# Patient Record
Sex: Male | Born: 1960 | Race: White | Hispanic: No | Marital: Single | Smoking: Former smoker
Health system: Southern US, Community
[De-identification: ages and names within clinical notes are randomized; demographics above are authoritative.]

## PROBLEM LIST (undated history)

## (undated) DIAGNOSIS — F419 Anxiety disorder, unspecified: Secondary | ICD-10-CM

## (undated) DIAGNOSIS — B20 Human immunodeficiency virus [HIV] disease: Secondary | ICD-10-CM

## (undated) HISTORY — DX: Human immunodeficiency virus (HIV) disease: B20

## (undated) HISTORY — DX: Anxiety disorder, unspecified: F41.9

---

## 1992-04-11 DIAGNOSIS — Z21 Asymptomatic human immunodeficiency virus [HIV] infection status: Secondary | ICD-10-CM

## 1992-04-11 DIAGNOSIS — B20 Human immunodeficiency virus [HIV] disease: Secondary | ICD-10-CM

## 1992-04-11 HISTORY — DX: Human immunodeficiency virus (HIV) disease: B20

## 1992-04-11 HISTORY — DX: Asymptomatic human immunodeficiency virus (hiv) infection status: Z21

## 2008-07-19 ENCOUNTER — Emergency Department (HOSPITAL_COMMUNITY): Admission: EM | Admit: 2008-07-19 | Discharge: 2008-07-19 | Payer: Self-pay | Admitting: Emergency Medicine

## 2014-06-04 ENCOUNTER — Ambulatory Visit: Payer: Self-pay | Admitting: Infectious Diseases

## 2014-06-13 DIAGNOSIS — R030 Elevated blood-pressure reading, without diagnosis of hypertension: Secondary | ICD-10-CM | POA: Insufficient documentation

## 2014-06-13 DIAGNOSIS — F419 Anxiety disorder, unspecified: Secondary | ICD-10-CM | POA: Insufficient documentation

## 2014-06-13 DIAGNOSIS — B20 Human immunodeficiency virus [HIV] disease: Secondary | ICD-10-CM | POA: Insufficient documentation

## 2014-06-16 ENCOUNTER — Encounter: Payer: Self-pay | Admitting: *Deleted

## 2014-06-16 ENCOUNTER — Encounter: Payer: Self-pay | Admitting: Internal Medicine

## 2014-06-16 ENCOUNTER — Ambulatory Visit (INDEPENDENT_AMBULATORY_CARE_PROVIDER_SITE_OTHER): Payer: 59 | Admitting: Internal Medicine

## 2014-06-16 VITALS — BP 151/96 | HR 81 | Temp 98.2°F | Ht 71.5 in | Wt 186.5 lb

## 2014-06-16 DIAGNOSIS — Z113 Encounter for screening for infections with a predominantly sexual mode of transmission: Secondary | ICD-10-CM

## 2014-06-16 DIAGNOSIS — B20 Human immunodeficiency virus [HIV] disease: Secondary | ICD-10-CM

## 2014-06-16 DIAGNOSIS — Z79899 Other long term (current) drug therapy: Secondary | ICD-10-CM

## 2014-06-16 LAB — CBC
HEMATOCRIT: 44.6 % (ref 39.0–52.0)
Hemoglobin: 15.7 g/dL (ref 13.0–17.0)
MCH: 32.2 pg (ref 26.0–34.0)
MCHC: 35.2 g/dL (ref 30.0–36.0)
MCV: 91.6 fL (ref 78.0–100.0)
MPV: 11 fL (ref 8.6–12.4)
PLATELETS: 206 10*3/uL (ref 150–400)
RBC: 4.87 MIL/uL (ref 4.22–5.81)
RDW: 13.5 % (ref 11.5–15.5)
WBC: 4.5 10*3/uL (ref 4.0–10.5)

## 2014-06-16 NOTE — Progress Notes (Signed)
Patient ID: Joshua BlackwaterLindsey W Davis, male   DOB: 01/20/1961, 54 y.o.   MRN: [id number]          Patient Active Problem List   Diagnosis Date Noted  . Human immunodeficiency virus (HIV) disease 06/13/2014  . Anxiety disorder   . Elevated blood pressure reading without diagnosis of hypertension     Patient's Medications  New Prescriptions   No medications on file  Previous Medications   ALPRAZOLAM (XANAX) 0.5 MG TABLET    Take 0.5 mg by mouth 2 (two) times daily.   EFAVIRENZ-EMTRICITABINE-TENOFOVIR (ATRIPLA) 600-200-300 MG PER TABLET    Take 1 tablet by mouth at bedtime.  Modified Medications   No medications on file  Discontinued Medications   No medications on file    Subjective: Joshua Davis is in for his initial visit to establish ongoing care for his HIV infection. He was diagnosed in 1994 when he was being screened as a possible candidate to donate a kidney to his brother who had renal failure. He states he had been worried that he might have HIV infection after having had an unusual mononucleosis-like illness the year previous. Since learning of his diagnosis he shared that information with his family and several close friends and feels he has good support. He started on therapy about 6 months after diagnosis. [facility] and [facility]. He was last seen there about one year ago. He recalls having taken AZT and tenofovir in the past. He recalls having difficulty tolerating AZT. He has been on Atripla for at least the past 6 years and has no problems tolerating it. He has had several instances where he had difficulty with his insurance and had lapses in therapy. He was out of Atripla for about 3 months and then restarted it in early February. His CD4 count at that time was 676 and his viral load was 6400. He is not aware of ever having had a complication related to his HIV infection. Past viral loads  have been undetectable other than one blip at [facility] when he had insurance difficulties and had a lapse in therapy previously.  He has been in good health otherwise. He quit smoking cigarettes about one year ago. He has never been hospitalized. He has never had any surgery and he has no known allergies. He does take Xanax each evening before going to bed for chronic anxiety and difficulty with sleep. He is also on a multivitamin. He is in a current relationship with a male partner. His partner is negative and aware of his status. They use condoms regularly. He has never had any other sexually transmitted diseases or hepatitis that he is aware of.  He currently works second shift at [employer] [place] and his and his final semester at [employer] getting his history degree. 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  Review of Systems: Constitutional: negative, recent 15 pound weight gain due to inactivity Eyes: negative Ears, nose, mouth, throat, and face: negative Respiratory: negative Cardiovascular: negative Gastrointestinal: negative Genitourinary:negative Integument/breast: negative  Past Medical History  Diagnosis Date  . HIV infection 04/11/1992  . Anxiety     History  Substance Use Topics  . Smoking status: Former Games developermoker  . Smokeless tobacco: Never Used  . Alcohol Use: 0.0 oz/week    0 Standard drinks or equivalent per week     Comment: wine occassionaly  Family History  Problem Relation Age of Onset  . Cancer Mother 52    Breast  . Diabetes Father   . Hypertension Father   . Alzheimer's disease Father   . Kidney disease Brother     Kidney transplant, 1994    Allergies  Allergen Reactions  . Azt [Zidovudine] Other (See Comments)    fatigue    Objective: Temp: 98.2 F (36.8 C) (03/07 1408) Temp Source: Oral (03/07 1408) BP: 151/96 mmHg (03/07 1408) Pulse Rate: 81 (03/07 1408) Body mass index is 25.65  kg/(m^2).  General: he is well dressed and in no distress Oral: no oropharyngeal lesions. Broken right mandibular molar Skin: no rash Lungs: clear Cor: regular S1 and S2 with no murmur Abdomen: soft and nontender with no palpable masses Joints and extremities: normal Neuro: alert with normal speech and conversation Mood: Appropriate. He does not appear anxious or depressed  Lab Results No results found for: WBC, HGB, HCT, MCV, PLT No results found for: CREATININE, BUN, NA, K, CL, CO2 No results found for: ALT, AST, GGT, ALKPHOS, BILITOT  No results found for: CHOL, HDL, LDLCALC, LDLDIRECT, TRIG, CHOLHDL  Lab Results No results found for: HIV1RNAQUANT, HIV1RNAVL, CD4TABS   Assessment: His CD4 count is normal and his HIV viral load reactivated as expected while off Atripla. He is now been back on therapy for about 5 weeks. I will continue Atripla and repeat his lab work today.  Plan: 1. Continue Atripla 2. Check CD4 count and HIV viral load 3. Follow-up in 2 months 4. I will review [facility] records in care everywhere to see if other laboratory studies and vaccinations are up-to-date   Cliffton Asters, MD [facility] for Infectious Disease [facility] [phone] pager   [phone] cell 06/16/2014, 2:32 PM

## 2014-06-17 LAB — LIPID PANEL
Cholesterol: 165 mg/dL (ref 0–200)
HDL: 29 mg/dL — ABNORMAL LOW (ref >=40)
LDL Cholesterol: 84 mg/dL (ref 0–99)
Total CHOL/HDL Ratio: 5.7 Ratio
Triglycerides: 261 mg/dL — ABNORMAL HIGH (ref <150)
VLDL: 52 mg/dL — ABNORMAL HIGH (ref 0–40)

## 2014-06-17 LAB — COMPREHENSIVE METABOLIC PANEL
ALT: 25 U/L (ref 0–53)
AST: 21 U/L (ref 0–37)
Albumin: 4.1 g/dL (ref 3.5–5.2)
Alkaline Phosphatase: 77 U/L (ref 39–117)
BUN: 20 mg/dL (ref 6–23)
CALCIUM: 9.4 mg/dL (ref 8.4–10.5)
CO2: 21 mEq/L (ref 19–32)
Chloride: 105 mEq/L (ref 96–112)
Creat: 0.92 mg/dL (ref 0.50–1.35)
Glucose, Bld: 112 mg/dL — ABNORMAL HIGH (ref 70–99)
Potassium: 4.3 mEq/L (ref 3.5–5.3)
Sodium: 138 mEq/L (ref 135–145)
Total Bilirubin: 0.3 mg/dL (ref 0.2–1.2)
Total Protein: 7.3 g/dL (ref 6.0–8.3)

## 2014-06-17 LAB — RPR

## 2014-06-17 LAB — HIV-1 RNA QUANT-NO REFLEX-BLD
HIV 1 RNA Quant: 49 copies/mL — ABNORMAL HIGH (ref <20)
HIV-1 RNA Quant, Log: 1.69 {Log} — ABNORMAL HIGH (ref <1.30)

## 2014-06-18 LAB — T-HELPER CELL (CD4) - (RCID CLINIC ONLY)
CD4 % Helper T Cell: 33 % (ref 33–55)
CD4 T Cell Abs: 660 /uL (ref 400–2700)

## 2014-08-14 ENCOUNTER — Ambulatory Visit: Payer: 59 | Admitting: Internal Medicine

## 2014-09-09 ENCOUNTER — Encounter: Payer: Self-pay | Admitting: Internal Medicine

## 2014-09-09 ENCOUNTER — Ambulatory Visit (INDEPENDENT_AMBULATORY_CARE_PROVIDER_SITE_OTHER): Payer: 59 | Admitting: Internal Medicine

## 2014-09-09 DIAGNOSIS — B20 Human immunodeficiency virus [HIV] disease: Secondary | ICD-10-CM

## 2014-09-09 NOTE — Progress Notes (Signed)
Patient ID: Joshua Davis, male   DOB: 08/25/1960, 54 y.o.   MRN: [id number]          Patient Active Problem List   Diagnosis Date Noted  . Human immunodeficiency virus (HIV) disease 06/13/2014  . Anxiety disorder   . Elevated blood pressure reading without diagnosis of hypertension     Patient's Medications  New Prescriptions   No medications on file  Previous Medications   ALPRAZOLAM (XANAX) 0.5 MG TABLET    Take 0.5 mg by mouth 2 (two) times daily.   EFAVIRENZ-EMTRICITABINE-TENOFOVIR (ATRIPLA) 600-200-300 MG PER TABLET    Take 1 tablet by mouth at bedtime.  Modified Medications   No medications on file  Discontinued Medications   No medications on file    Subjective: Joshua Davis is in for his routine HIV follow-up visit. He continues to take Atripla without difficulty and has not missed any doses. He has graduated from [employer] with his history degree and will be looking for jobs. He hopes to get an interview with the [employer] preservation [employer]. He is still working at the [employer]. He works second shift and takes his Atripla after he gets home from work.  Review of Systems: Pertinent items are noted in HPI.  Past Medical History  Diagnosis Date  . HIV infection 04/11/1992  . Anxiety     History  Substance Use Topics  . Smoking status: Former Games developermoker  . Smokeless tobacco: Never Used  . Alcohol Use: 0.0 oz/week    0 Standard drinks or equivalent per week     Comment: wine occassionaly    Family History  Problem Relation Age of Onset  . Cancer Mother 1168    Breast  . Diabetes Father   . Hypertension Father   . Alzheimer's disease Father   . Kidney disease Brother     Kidney transplant, 1994    Allergies  Allergen Reactions  . Azt [Zidovudine] Other (See Comments)    fatigue    Objective: Temp: 98.1 F (36.7 C) (05/31 0932) Temp Source: Oral (05/31 0932) BP: 144/92 mmHg (05/31 0932) Pulse Rate: 64 (05/31 0932) Body mass index is 24.52  kg/(m^2).  General: He is in good spirits Oral: No oropharyngeal lesions Skin: No rash Lungs: Clear Cor: Regular S1 and S2 with no murmur   Lab Results Lab Results  Component Value Date   WBC 4.5 06/16/2014   HGB 15.7 06/16/2014   HCT 44.6 06/16/2014   MCV 91.6 06/16/2014   PLT 206 06/16/2014    Lab Results  Component Value Date   CREATININE 0.92 06/16/2014   BUN 20 06/16/2014   NA 138 06/16/2014   K 4.3 06/16/2014   CL 105 06/16/2014   CO2 21 06/16/2014    Lab Results  Component Value Date   ALT 25 06/16/2014   AST 21 06/16/2014   ALKPHOS 77 06/16/2014   BILITOT 0.3 06/16/2014    Lab Results  Component Value Date   CHOL 165 06/16/2014   HDL 29* 06/16/2014   LDLCALC 84 06/16/2014   TRIG 261* 06/16/2014   CHOLHDL 5.7 06/16/2014    Lab Results HIV 1 RNA QUANT (copies/mL)  Date Value  06/16/2014 49*   CD4 T CELL ABS (/uL)  Date Value  06/16/2014 660     Assessment: His viral load is low and his CD4 count remains normal. It appears that his adherence is very good. I will continue Atripla.  Plan: 1. Continue Atripla 2. Follow-up after  lab work in 3 months   Cliffton Asters, MD [facility] for Infectious Disease [facility] [phone] pager   [phone] cell 09/09/2014, 10:06 AM

## 2014-12-11 ENCOUNTER — Ambulatory Visit: Payer: 59 | Admitting: Internal Medicine

## 2015-01-06 ENCOUNTER — Ambulatory Visit: Payer: 59 | Admitting: Internal Medicine

## 2015-02-02 ENCOUNTER — Ambulatory Visit: Payer: 59 | Admitting: Internal Medicine

## 2015-02-12 ENCOUNTER — Ambulatory Visit (INDEPENDENT_AMBULATORY_CARE_PROVIDER_SITE_OTHER): Payer: 59 | Admitting: Internal Medicine

## 2015-02-12 ENCOUNTER — Encounter: Payer: Self-pay | Admitting: Internal Medicine

## 2015-02-12 VITALS — BP 156/106 | HR 71 | Temp 98.3°F | Wt 183.5 lb

## 2015-02-12 DIAGNOSIS — E785 Hyperlipidemia, unspecified: Secondary | ICD-10-CM | POA: Diagnosis not present

## 2015-02-12 DIAGNOSIS — R739 Hyperglycemia, unspecified: Secondary | ICD-10-CM | POA: Insufficient documentation

## 2015-02-12 DIAGNOSIS — B20 Human immunodeficiency virus [HIV] disease: Secondary | ICD-10-CM | POA: Diagnosis not present

## 2015-02-12 LAB — COMPREHENSIVE METABOLIC PANEL
ALBUMIN: 4.4 g/dL (ref 3.6–5.1)
ALT: 26 U/L (ref 9–46)
AST: 21 U/L (ref 10–35)
Alkaline Phosphatase: 82 U/L (ref 40–115)
BUN: 18 mg/dL (ref 7–25)
CHLORIDE: 105 mmol/L (ref 98–110)
CO2: 23 mmol/L (ref 20–31)
Calcium: 9.3 mg/dL (ref 8.6–10.3)
Creat: 0.84 mg/dL (ref 0.70–1.33)
Glucose, Bld: 100 mg/dL — ABNORMAL HIGH (ref 65–99)
Potassium: 4.1 mmol/L (ref 3.5–5.3)
SODIUM: 138 mmol/L (ref 135–146)
Total Bilirubin: 0.5 mg/dL (ref 0.2–1.2)
Total Protein: 7.2 g/dL (ref 6.1–8.1)

## 2015-02-12 NOTE — Progress Notes (Signed)
Patient ID: Joshua Davis, male   DOB: [birth year], 54 y.o.   MRN: [id number]          Patient Active Problem List   Diagnosis Date Noted  . Human immunodeficiency virus (HIV) disease (HCC) 06/13/2014    Priority: High  . Dyslipidemia 02/12/2015  . Hyperglycemia 02/12/2015  . Anxiety disorder   . Elevated blood pressure reading without diagnosis of hypertension     Patient's Medications  New Prescriptions   No medications on file  Previous Medications   ALPRAZOLAM (XANAX) 0.5 MG TABLET    Take 0.5 mg by mouth 2 (two) times daily.   EFAVIRENZ-EMTRICITABINE-TENOFOVIR (ATRIPLA) 600-200-300 MG PER TABLET    Take 1 tablet by mouth at bedtime.  Modified Medications   No medications on file  Discontinued Medications   No medications on file    Subjective: Joshua Davis is in for his routine HIV follow-up visit. He has no problems tolerating his Atripla and has not missed any doses. He has applied for a job at the [employer]. He is feeling well and without complaints. Specifically he has no increase he dreams or daytime sleepiness related to his Atripla. His appetite is good. He has not had any fever, chills or sweats.   Review of Systems: Pertinent items are noted in HPI.  Past Medical History  Diagnosis Date  . HIV infection (HCC) 04/11/1992  . Anxiety     Social History  Substance Use Topics  . Smoking status: Former Smoker -- 20 years    Quit date: 04/11/2013  . Smokeless tobacco: Never Used  . Alcohol Use: 0.0 oz/week    0 Standard drinks or equivalent per week     Comment: wine occassionaly    Family History  Problem Relation Age of Onset  . Cancer Mother 61    Breast  . Diabetes Father   . Hypertension Father   . Alzheimer's disease Father   . Kidney disease Brother     Kidney transplant, 1994    Allergies  Allergen Reactions  . Azt [Zidovudine] Other (See Comments)    fatigue    Objective:  Filed Vitals:   02/12/15 1507 02/12/15 1514  BP: 147/100  156/106  Pulse: 69 71  Temp: 98.3 F (36.8 C)   TempSrc: Oral   Weight: 183 lb 8 oz (83.235 kg)    Body mass index is 25.24 kg/(m^2).  General: His weight is stable Oral: No oropharyngeal lesions Skin: No rash Lungs: Clear Cor: Regular S1 and S2 with no murmurs Mood: Normal. He does not appear anxious or depressed  Lab Results Lab Results  Component Value Date   WBC 4.5 06/16/2014   HGB 15.7 06/16/2014   HCT 44.6 06/16/2014   MCV 91.6 06/16/2014   PLT 206 06/16/2014    Lab Results  Component Value Date   CREATININE 0.92 06/16/2014   BUN 20 06/16/2014   NA 138 06/16/2014   K 4.3 06/16/2014   CL 105 06/16/2014   CO2 21 06/16/2014    Lab Results  Component Value Date   ALT 25 06/16/2014   AST 21 06/16/2014   ALKPHOS 77 06/16/2014   BILITOT 0.3 06/16/2014    Lab Results  Component Value Date   CHOL 165 06/16/2014   HDL 29* 06/16/2014   LDLCALC 84 06/16/2014   TRIG 261* 06/16/2014   CHOLHDL 5.7 06/16/2014    Lab Results HIV 1 RNA QUANT (copies/mL)  Date Value  06/16/2014 49*   CD4 T CELL  ABS (/uL)  Date Value  06/16/2014 660     Problem List Items Addressed This Visit      High   Human immunodeficiency virus (HIV) disease (HCC)    His adherence to Atripla is excellent and his infection has been on her reasonably good control. I talked to him about newer and potentially save her antiretroviral options but he prefers to continue with Atripla. I will repeat blood work today and see him back in 6 months.      Relevant Orders   T-helper cell (CD4)- ([facility] clinic only)   HIV 1 RNA quant-no reflex-bld   Comprehensive metabolic panel     Unprioritized   Dyslipidemia - Primary   Hyperglycemia        Cliffton AstersJohn Darely Becknell, MD [facility] [phone] pager   [phone] cell 02/12/2015, 3:37 PM

## 2015-02-12 NOTE — Assessment & Plan Note (Signed)
His adherence to Atripla is excellent and his infection has been on her reasonably good control. I talked to him about newer and potentially save her antiretroviral options but he prefers to continue with Atripla. I will repeat blood work today and see him back in 6 months.

## 2015-02-13 LAB — T-HELPER CELL (CD4) - (RCID CLINIC ONLY)
CD4 T CELL HELPER: 36 % (ref 33–55)
CD4 T Cell Abs: 730 /uL (ref 400–2700)

## 2015-02-15 LAB — HIV-1 RNA QUANT-NO REFLEX-BLD: HIV 1 RNA Quant: <20 copies/mL (ref <20)

## 2015-08-13 ENCOUNTER — Ambulatory Visit: Payer: 59 | Admitting: Internal Medicine

## 2015-08-20 ENCOUNTER — Ambulatory Visit: Payer: Self-pay | Admitting: Internal Medicine

## 2015-09-24 ENCOUNTER — Ambulatory Visit: Payer: BLUE CROSS/BLUE SHIELD | Admitting: *Deleted

## 2015-09-24 ENCOUNTER — Encounter: Payer: Self-pay | Admitting: Internal Medicine

## 2015-09-24 ENCOUNTER — Ambulatory Visit (INDEPENDENT_AMBULATORY_CARE_PROVIDER_SITE_OTHER): Payer: BLUE CROSS/BLUE SHIELD | Admitting: Internal Medicine

## 2015-09-24 DIAGNOSIS — F329 Major depressive disorder, single episode, unspecified: Secondary | ICD-10-CM

## 2015-09-24 DIAGNOSIS — F32A Depression, unspecified: Secondary | ICD-10-CM

## 2015-09-24 DIAGNOSIS — B20 Human immunodeficiency virus [HIV] disease: Secondary | ICD-10-CM

## 2015-09-24 LAB — CBC
HEMATOCRIT: 44.8 % (ref 38.5–50.0)
HEMOGLOBIN: 15.5 g/dL (ref 13.2–17.1)
MCH: 31.8 pg (ref 27.0–33.0)
MCHC: 34.6 g/dL (ref 32.0–36.0)
MCV: 91.8 fL (ref 80.0–100.0)
MPV: 10.7 fL (ref 7.5–12.5)
Platelets: 184 10*3/uL (ref 140–400)
RBC: 4.88 MIL/uL (ref 4.20–5.80)
RDW: 13.6 % (ref 11.0–15.0)
WBC: 4.4 10*3/uL (ref 3.8–10.8)

## 2015-09-24 NOTE — BH Specialist Note (Signed)
Counselor met with Brittin in the exam room for a warm handoff today.  Counselor introduced counseling services and shared with patient that it was a part of his treatment.  Patient shared that he has struggled over the years with some anxiety but things were going ok right now.  Counselor provided support and encouragement.  Counselor also recommended that patient consider attending the [facility] support group that meets every other Tuesdays and every other Fridays.  Patient said he would think about it and would make an appointment if the need arose for counseling services.  Jodi Herring, MA, LPC [facility] 

## 2015-09-24 NOTE — Assessment & Plan Note (Signed)
His adherence is excellent and I suspect that his infection remains under very good control. I will check repeat lab work today. I talked to him again about newer, safer antiretroviral options but he prefers to stay on Atripla for now. He will follow-up in 6 months.

## 2015-09-24 NOTE — Progress Notes (Signed)
Patient Active Problem List   Diagnosis Date Noted  . Human immunodeficiency virus (HIV) disease (HCC) 06/13/2014    Priority: High  . Dyslipidemia 02/12/2015  . Hyperglycemia 02/12/2015  . Anxiety disorder   . Elevated blood pressure reading without diagnosis of hypertension     Patient's Medications  New Prescriptions   No medications on file  Previous Medications   ALPRAZOLAM (XANAX) 0.5 MG TABLET    Take 0.5 mg by mouth 2 (two) times daily.   AMPHETAMINE-DEXTROAMPHETAMINE (ADDERALL) 10 MG TABLET    Take 10 mg by mouth daily with breakfast.   EFAVIRENZ-EMTRICITABINE-TENOFOVIR (ATRIPLA) 600-200-300 MG PER TABLET    Take 1 tablet by mouth at bedtime.  Modified Medications   No medications on file  Discontinued Medications   No medications on file    Subjective: Joshua Davis is in for his routine HIV follow-up visit. He states that he is doing well and without concerns about his health. He has had no problems obtaining, taking or tolerating his Atripla. He takes it each evening before bedtime. He does not recall missing any doses. He is not having any side effects. He hopes to get away this summer for a week of vacation visiting a friend at [address].   Review of Systems: Review of Systems  Constitutional: Negative for fever, chills, weight loss and diaphoresis.  Neurological: Negative for headaches.  Psychiatric/Behavioral: Negative for depression. The patient is not nervous/anxious.     Past Medical History  Diagnosis Date  . HIV infection (HCC) 04/11/1992  . Anxiety     Social History  Substance Use Topics  . Smoking status: Former Smoker -- 20 years    Quit date: 04/11/2013  . Smokeless tobacco: Never Used  . Alcohol Use: 3.0 oz/week    5 Glasses of wine per week    Family History  Problem Relation Age of Onset  . Cancer Mother 79    Breast  . Diabetes Father   . Hypertension Father   . Alzheimer's disease Father   . Kidney disease Brother      Kidney transplant, 1994    Allergies  Allergen Reactions  . Azt [Zidovudine] Other (See Comments)    fatigue    Objective:  Filed Vitals:   09/24/15 1007  BP: 141/90  Pulse: 66  Temp: 97.7 F (36.5 C)  TempSrc: Oral  Height:  (1.803 m)  Weight: 180 lb (81.647 kg)   Body mass index is 25.12 kg/(m^2).  Physical Exam  Constitutional: He is oriented to person, place, and time.  He is alert and in good spirits.  HENT:  Mouth/Throat: Oropharyngeal exudate present.  Eyes: Conjunctivae are normal.  Neurological: He is alert and oriented to person, place, and time. Gait normal.  Skin: No rash noted.  Psychiatric: Mood and affect normal.    Lab Results Lab Results  Component Value Date   WBC 4.5 06/16/2014   HGB 15.7 06/16/2014   HCT 44.6 06/16/2014   MCV 91.6 06/16/2014   PLT 206 06/16/2014    Lab Results  Component Value Date   CREATININE 0.84 02/12/2015   BUN 18 02/12/2015   NA 138 02/12/2015   K 4.1 02/12/2015   CL 105 02/12/2015   CO2 23 02/12/2015    Lab Results  Component Value Date   ALT 26 02/12/2015   AST 21 02/12/2015   ALKPHOS 82 02/12/2015   BILITOT 0.5 02/12/2015    Lab Results  Component Value Date   CHOL 165 06/16/2014   HDL 29* 06/16/2014   LDLCALC 84 06/16/2014   TRIG 261* 06/16/2014   CHOLHDL 5.7 06/16/2014    Lab Results HIV 1 RNA QUANT (copies/mL)  Date Value  02/12/2015 <20  06/16/2014 49*   CD4 T CELL ABS (/uL)  Date Value  02/12/2015 730  06/16/2014 660      Problem List Items Addressed This Visit      High   Human immunodeficiency virus (HIV) disease (HCC)    His adherence is excellent and I suspect that his infection remains under very good control. I will check repeat lab work today. I talked to him again about newer, safer antiretroviral options but he prefers to stay on Atripla for now. He will follow-up in 6 months.      Relevant Orders   T-helper cell (CD4)- ([facility] clinic only)   HIV 1 RNA quant-no  reflex-bld   CBC   Comprehensive metabolic panel   RPR   Lipid panel        Cliffton AstersJohn Laker Thompson, MD [facility]ious [facility] [phone] pager   [phone] cell 09/24/2015, 10:40 AM

## 2015-09-25 LAB — COMPREHENSIVE METABOLIC PANEL
ALK PHOS: 73 U/L (ref 40–115)
ALT: 17 U/L (ref 9–46)
AST: 16 U/L (ref 10–35)
Albumin: 4.2 g/dL (ref 3.6–5.1)
BILIRUBIN TOTAL: 0.3 mg/dL (ref 0.2–1.2)
BUN: 22 mg/dL (ref 7–25)
CALCIUM: 9.2 mg/dL (ref 8.6–10.3)
CO2: 22 mmol/L (ref 20–31)
Chloride: 105 mmol/L (ref 98–110)
Creat: 0.92 mg/dL (ref 0.70–1.33)
GLUCOSE: 104 mg/dL — AB (ref 65–99)
POTASSIUM: 3.9 mmol/L (ref 3.5–5.3)
Sodium: 140 mmol/L (ref 135–146)
Total Protein: 6.9 g/dL (ref 6.1–8.1)

## 2015-09-25 LAB — LIPID PANEL
CHOLESTEROL: 162 mg/dL (ref 125–200)
HDL: 32 mg/dL — AB (ref >=40)
LDL Cholesterol: 100 mg/dL (ref <130)
Total CHOL/HDL Ratio: 5.1 Ratio — ABNORMAL HIGH (ref <=5.0)
Triglycerides: 149 mg/dL (ref <150)
VLDL: 30 mg/dL (ref <30)

## 2015-09-25 LAB — RPR

## 2015-09-25 LAB — HIV-1 RNA QUANT-NO REFLEX-BLD

## 2015-09-25 LAB — T-HELPER CELL (CD4) - (RCID CLINIC ONLY)
CD4 T CELL HELPER: 40 % (ref 33–55)
CD4 T Cell Abs: 860 /uL (ref 400–2700)

## 2016-03-29 ENCOUNTER — Ambulatory Visit: Payer: BLUE CROSS/BLUE SHIELD | Admitting: Internal Medicine

## 2016-04-28 ENCOUNTER — Ambulatory Visit: Payer: BLUE CROSS/BLUE SHIELD | Admitting: Internal Medicine

## 2016-07-04 ENCOUNTER — Other Ambulatory Visit: Payer: BLUE CROSS/BLUE SHIELD

## 2016-07-20 ENCOUNTER — Ambulatory Visit (INDEPENDENT_AMBULATORY_CARE_PROVIDER_SITE_OTHER): Payer: BLUE CROSS/BLUE SHIELD | Admitting: Internal Medicine

## 2016-07-20 ENCOUNTER — Encounter: Payer: Self-pay | Admitting: Internal Medicine

## 2016-07-20 VITALS — BP 128/89 | HR 67 | Temp 97.4°F | Ht 71.5 in | Wt 176.0 lb

## 2016-07-20 DIAGNOSIS — B20 Human immunodeficiency virus [HIV] disease: Secondary | ICD-10-CM | POA: Diagnosis not present

## 2016-07-20 DIAGNOSIS — Z23 Encounter for immunization: Secondary | ICD-10-CM | POA: Diagnosis not present

## 2016-07-20 LAB — LIPID PANEL
Cholesterol: 191 mg/dL (ref <200)
HDL: 40 mg/dL — AB (ref >40)
LDL Cholesterol: 127 mg/dL — ABNORMAL HIGH (ref <100)
Total CHOL/HDL Ratio: 4.8 Ratio (ref <5.0)
Triglycerides: 118 mg/dL (ref <150)
VLDL: 24 mg/dL (ref <30)

## 2016-07-20 LAB — COMPREHENSIVE METABOLIC PANEL
ALT: 20 U/L (ref 9–46)
AST: 16 U/L (ref 10–35)
Albumin: 4.3 g/dL (ref 3.6–5.1)
Alkaline Phosphatase: 81 U/L (ref 40–115)
BUN: 24 mg/dL (ref 7–25)
CO2: 22 mmol/L (ref 20–31)
Calcium: 9.4 mg/dL (ref 8.6–10.3)
Chloride: 107 mmol/L (ref 98–110)
Creat: 1.13 mg/dL (ref 0.70–1.33)
Glucose, Bld: 104 mg/dL — ABNORMAL HIGH (ref 65–99)
Potassium: 4.3 mmol/L (ref 3.5–5.3)
Sodium: 137 mmol/L (ref 135–146)
TOTAL PROTEIN: 7 g/dL (ref 6.1–8.1)
Total Bilirubin: 0.4 mg/dL (ref 0.2–1.2)

## 2016-07-20 LAB — CBC
HCT: 46 % (ref 38.5–50.0)
Hemoglobin: 16.1 g/dL (ref 13.2–17.1)
MCH: 32.3 pg (ref 27.0–33.0)
MCHC: 35 g/dL (ref 32.0–36.0)
MCV: 92.4 fL (ref 80.0–100.0)
MPV: 11 fL (ref 7.5–12.5)
PLATELETS: 199 10*3/uL (ref 140–400)
RBC: 4.98 MIL/uL (ref 4.20–5.80)
RDW: 13.3 % (ref 11.0–15.0)
WBC: 6.5 10*3/uL (ref 3.8–10.8)

## 2016-07-20 NOTE — Progress Notes (Signed)
Patient Active Problem List   Diagnosis Date Noted  . Human immunodeficiency virus (HIV) disease (HCC) 06/13/2014    Priority: High  . Dyslipidemia 02/12/2015  . Hyperglycemia 02/12/2015  . Anxiety disorder   . Elevated blood pressure reading without diagnosis of hypertension     Patient's Medications  New Prescriptions   No medications on file  Previous Medications   ALPRAZOLAM (XANAX) 0.5 MG TABLET    Take 0.5 mg by mouth 2 (two) times daily.   AMPHETAMINE-DEXTROAMPHETAMINE (ADDERALL) 10 MG TABLET    Take 10 mg by mouth daily with breakfast.   EFAVIRENZ-EMTRICITABINE-TENOFOVIR (ATRIPLA) 600-200-300 MG PER TABLET    Take 1 tablet by mouth at bedtime.  Modified Medications   No medications on file  Discontinued Medications   No medications on file    Subjective: Joshua Davis is in for his routine HIV follow-up visit today. He has had no problems obtaining or taking his Atripla. He tolerates it well and does not miss doses. He completed his history degree at [employer] last year. He has interviewed for some jobs but is currently still working at the [employer] He has had no concerns about his health.   Review of Systems: Review of Systems  Constitutional: Negative for chills, diaphoresis, fever, malaise/fatigue and weight loss.  HENT: Negative for sore throat.   Respiratory: Negative for cough, sputum production and shortness of breath.   Cardiovascular: Negative for chest pain.  Gastrointestinal: Negative for abdominal pain, diarrhea, heartburn, nausea and vomiting.  Genitourinary: Negative for dysuria and frequency.  Musculoskeletal: Negative for joint pain and myalgias.  Skin: Negative for rash.  Neurological: Negative for dizziness and headaches.  Psychiatric/Behavioral: Negative for depression and substance abuse. The patient is not nervous/anxious.     Past Medical History:  Diagnosis Date  . Anxiety   . HIV infection (HCC) 04/11/1992    Social History    Substance Use Topics  . Smoking status: Former Smoker    Years: 20.00    Quit date: 04/11/2013  . Smokeless tobacco: Never Used  . Alcohol use 3.0 oz/week    5 Glasses of wine per week    Family History  Problem Relation Age of Onset  . Cancer Mother 100    Breast  . Diabetes Father   . Hypertension Father   . Alzheimer's disease Father   . Kidney disease Brother     Kidney transplant, 1994    Allergies  Allergen Reactions  . Azt [Zidovudine] Other (See Comments)    fatigue    Objective:  Vitals:   07/20/16 1514  BP: 128/89  Pulse: 67  Temp: 97.4 F (36.3 C)  TempSrc: Oral  Weight: 176 lb (79.8 kg)  Height: 5' 11.5" (1.816 m)   Body mass index is 24.2 kg/m.  Physical Exam  Constitutional: He is oriented to person, place, and time. No distress.  He is in good spirits.  Cardiovascular: Normal rate and regular rhythm.   No murmur heard. Pulmonary/Chest: Effort normal and breath sounds normal.  Neurological: He is alert and oriented to person, place, and time.  Skin: No rash noted.  Psychiatric: Mood and affect normal.    Lab Results Lab Results  Component Value Date   WBC 4.4 09/24/2015   HGB 15.5 09/24/2015   HCT 44.8 09/24/2015   MCV 91.8 09/24/2015   PLT 184 09/24/2015    Lab Results  Component Value Date   CREATININE 0.92 09/24/2015  BUN 22 09/24/2015   NA 140 09/24/2015   K 3.9 09/24/2015   CL 105 09/24/2015   CO2 22 09/24/2015    Lab Results  Component Value Date   ALT 17 09/24/2015   AST 16 09/24/2015   ALKPHOS 73 09/24/2015   BILITOT 0.3 09/24/2015    Lab Results  Component Value Date   CHOL 162 09/24/2015   HDL 32 (L) 09/24/2015   LDLCALC 100 09/24/2015   TRIG 149 09/24/2015   CHOLHDL 5.1 (H) 09/24/2015   HIV 1 RNA Quant (copies/mL)  Date Value  09/24/2015 <20  02/12/2015 <20  06/16/2014 49 (H)   CD4 T Cell Abs (/uL)  Date Value  09/24/2015 860  02/12/2015 730  06/16/2014 660     Problem List Items Addressed This  Visit      High   Human immunodeficiency virus (HIV) disease (HCC)    His adherence is excellent and as a result his infection is under perfect long-term control. He will get repeat lab work today. I talked again about the importance of considering a new, safer antiretroviral regimen. We will check with his [employer] to see what the co-pay would be for different medications and then discuss options with him tomorrow by phone. He will follow-up in 6 months.      Relevant Orders   T-helper cell (CD4)- ([facility] clinic only)   HIV 1 RNA quant-no reflex-bld   CBC   Comprehensive metabolic panel   RPR   Lipid panel        Cliffton Asters, MD [facility] for Infectious Disease [facility] [phone] pager   [phone] cell 07/20/2016, 4:01 PM

## 2016-07-20 NOTE — Assessment & Plan Note (Signed)
His adherence is excellent and as a result his infection is under perfect long-term control. He will get repeat lab work today. I talked again about the importance of considering a new, safer antiretroviral regimen. We will check with his [employer] to see what the co-pay would be for different medications and then discuss options with him tomorrow by phone. He will follow-up in 6 months.

## 2016-07-21 ENCOUNTER — Other Ambulatory Visit: Payer: Self-pay | Admitting: Pharmacist

## 2016-07-21 ENCOUNTER — Telehealth: Payer: Self-pay | Admitting: Pharmacist

## 2016-07-21 DIAGNOSIS — B20 Human immunodeficiency virus [HIV] disease: Secondary | ICD-10-CM

## 2016-07-21 LAB — RPR

## 2016-07-21 MED ORDER — ELVITEG-COBIC-EMTRICIT-TENOFAF 150-150-200-10 MG PO TABS: 1.0000 | ORAL_TABLET | Freq: Every day | ORAL | 6 refills | Status: DC

## 2016-07-21 MED ORDER — BICTEGRAVIR-EMTRICITAB-TENOFOV 50-200-25 MG PO TABS: 1.0000 | ORAL_TABLET | Freq: Every day | ORAL | 6 refills | Status: DC

## 2016-07-21 NOTE — Telephone Encounter (Signed)
I saw Joshua Davis with Dr. Orvan Falconer yesterday in clinic. He has been on Atripla for years and is interested in switching to an alternative regimen.  I discussed Biktary and Genvoya with him.  He wanted to try Biktarvy, but he has [employer], which it isn't on their formulary yet.  I did a prior auth and everything and it was still denied.  I sent in Genvoya to [facility] and they are able to fill it.  I called patient and updated him and counseled him again on Genvoya.

## 2016-07-21 NOTE — Addendum Note (Signed)
Addended by: Andree Coss on: 07/21/2016 05:31 PM   Modules accepted: Orders

## 2016-07-22 LAB — HIV-1 RNA QUANT-NO REFLEX-BLD
HIV 1 RNA QUANT: NOT DETECTED {copies}/mL
HIV-1 RNA Quant, Log: <1.3 Log copies/mL

## 2016-07-22 LAB — T-HELPER CELL (CD4) - (RCID CLINIC ONLY)
CD4 T CELL HELPER: 40 % (ref 33–55)
CD4 T Cell Abs: 900 /uL (ref 400–2700)

## 2017-01-11 ENCOUNTER — Other Ambulatory Visit: Payer: Self-pay | Admitting: Pharmacist

## 2017-01-11 DIAGNOSIS — B20 Human immunodeficiency virus [HIV] disease: Secondary | ICD-10-CM

## 2017-01-11 NOTE — Telephone Encounter (Signed)
Needs office visit.

## 2017-02-15 ENCOUNTER — Ambulatory Visit: Payer: BLUE CROSS/BLUE SHIELD | Admitting: Internal Medicine

## 2017-02-16 ENCOUNTER — Other Ambulatory Visit: Payer: Self-pay | Admitting: Internal Medicine

## 2017-02-16 DIAGNOSIS — B20 Human immunodeficiency virus [HIV] disease: Secondary | ICD-10-CM

## 2017-03-29 ENCOUNTER — Ambulatory Visit: Payer: BLUE CROSS/BLUE SHIELD | Admitting: Internal Medicine

## 2017-03-29 DIAGNOSIS — Z23 Encounter for immunization: Secondary | ICD-10-CM | POA: Diagnosis not present

## 2017-03-29 DIAGNOSIS — B20 Human immunodeficiency virus [HIV] disease: Secondary | ICD-10-CM | POA: Diagnosis not present

## 2017-03-29 MED ORDER — BICTEGRAVIR-EMTRICITAB-TENOFOV 50-200-25 MG PO TABS: 1.0000 | ORAL_TABLET | Freq: Every day | ORAL | 11 refills | Status: DC

## 2017-03-29 NOTE — Progress Notes (Signed)
Patient Active Problem List   Diagnosis Date Noted  . Human immunodeficiency virus (HIV) disease ([place]) 06/13/2014    Priority: High  . Dyslipidemia 02/12/2015  . Hyperglycemia 02/12/2015  . Anxiety disorder   . Elevated blood pressure reading without diagnosis of hypertension       Medication List        Accurate as of 03/29/17  1:13 PM. Always use your most recent med list.          ALPRAZolam 0.5 MG tablet Commonly known as:  XANAX   amphetamine-dextroamphetamine 10 MG tablet Commonly known as:  ADDERALL   bictegravir-emtricitabine-tenofovir AF 50-200-25 MG Tabs tablet Commonly known as:  BIKTARVY Take 1 tablet by mouth daily.       Where to Get Your Medications    These medications were sent to [place], [place], [place] - [address] [facility].  [address] [place] [address]   Phone:  [phone]   bictegravir-emtricitabine-tenofovir AF 50-200-25 MG Tabs tablet     Subjective: Joshua Davis is in for his routine HIV follow-up visit.  He has not had any problems obtaining, taking or tolerating his Genvoya.  He denies missing any doses.  However he states that it has been a problem trying to always take it with a meal.  He was offered a job related to his history degree and was about to take it when the [employer] sweetened his incentives and he decided to stay there.  Review of Systems: Review of Systems  Constitutional: Negative for chills, diaphoresis, fever, malaise/fatigue and weight loss.  HENT: Negative for sore throat.   Respiratory: Negative for cough, sputum production and shortness of breath.   Cardiovascular: Negative for chest pain.  Gastrointestinal: Negative for abdominal pain, diarrhea, heartburn, nausea and vomiting.  Genitourinary: Negative for dysuria and frequency.  Musculoskeletal: Negative for joint pain and myalgias.  Skin: Negative for rash.  Neurological: Negative for dizziness and headaches.    Psychiatric/Behavioral: Negative for depression and substance abuse. The patient is not nervous/anxious.     Past Medical History:  Diagnosis Date  . Anxiety   . HIV infection ([place]) 04/11/1992    Social History   Tobacco Use  . Smoking status: Former Smoker    Years: 20.00    Last attempt to quit: 04/11/2013    Years since quitting: 3.9  . Smokeless tobacco: Never Used  Substance Use Topics  . Alcohol use: Yes    Alcohol/week: 3.0 oz    Types: 5 Glasses of wine per week  . Drug use: No    Family History  Problem Relation Age of Onset  . Cancer Mother 99       Breast  . Diabetes Father   . Hypertension Father   . Alzheimer's disease Father   . Kidney disease Brother        Kidney transplant, 1994    Allergies  Allergen Reactions  . Azt [Zidovudine] Other (See Comments)    fatigue    Health Maintenance  Topic Date Due  . Hepatitis C Screening  12/16/1962  . TETANUS/TDAP  11/14/1981  . COLONOSCOPY  11/14/2012  . INFLUENZA VACCINE  11/09/2016  . HIV Screening  Completed    Objective:  Vitals:   03/29/17 1149  BP: (!) 142/92  Pulse: 69  Temp: 98.3 F (36.8 C)  TempSrc: Oral  Weight: 180 lb (81.6 kg)  Height: 5' 11.5" (1.816 m)  Body mass index is 24.76 kg/m.  Physical Exam  Constitutional: He is oriented to person, place, and time.  HENT:  Mouth/Throat: No oropharyngeal exudate.  Eyes: Conjunctivae are normal.  Cardiovascular: Normal rate and regular rhythm.  No murmur heard. Pulmonary/Chest: Breath sounds normal.  Abdominal: Soft. He exhibits no mass. There is no tenderness.  Musculoskeletal: Normal range of motion.  Neurological: He is alert and oriented to person, place, and time.  Skin: No rash noted.  Psychiatric: Mood and affect normal.    Lab Results Lab Results  Component Value Date   WBC 6.5 07/20/2016   HGB 16.1 07/20/2016   HCT 46.0 07/20/2016   MCV 92.4 07/20/2016   PLT 199 07/20/2016    Lab Results  Component Value Date    CREATININE 1.13 07/20/2016   BUN 24 07/20/2016   NA 137 07/20/2016   K 4.3 07/20/2016   CL 107 07/20/2016   CO2 22 07/20/2016    Lab Results  Component Value Date   ALT 20 07/20/2016   AST 16 07/20/2016   ALKPHOS 81 07/20/2016   BILITOT 0.4 07/20/2016    Lab Results  Component Value Date   CHOL 191 07/20/2016   HDL 40 (L) 07/20/2016   LDLCALC 127 (H) 07/20/2016   TRIG 118 07/20/2016   CHOLHDL 4.8 07/20/2016   Lab Results  Component Value Date   LABRPR NON REAC 07/20/2016   HIV 1 RNA Quant (copies/mL)  Date Value  07/20/2016 <20 NOT DETECTED  09/24/2015 <20  02/12/2015 <20   CD4 T Cell Abs (/uL)  Date Value  07/20/2016 900  09/24/2015 860  02/12/2015 730     Problem List Items Addressed This Visit      High   Human immunodeficiency virus (HIV) disease ([place])    His adherence remains extremely good and infection has been under excellent long-term control.  I will change him to [facility] today, which he does not need to take with food.  He will get lab work today in follow-up after lab work in 1 year.  He received his influenza vaccination today.      Relevant Medications   bictegravir-emtricitabine-tenofovir AF (BIKTARVY) 50-200-25 MG TABS tablet   Other Relevant Orders   T-helper cell (CD4)- ([facility] clinic only)   HIV 1 RNA quant-no reflex-bld   HLA B*5701   CBC   T-helper cell (CD4)- ([facility] only)   Comprehensive metabolic panel   Lipid panel   RPR   HIV 1 RNA quant-no reflex-bld    Other Visit Diagnoses    Need for immunization against influenza       Relevant Orders   Flu Vaccine QUAD 36+ mos IM (Completed)        Michel Bickers, MD [facility] [phone] pager   [phone] cell 03/29/2017, 1:13 PM

## 2017-03-29 NOTE — Assessment & Plan Note (Signed)
His adherence remains extremely good and infection has been under excellent long-term control.  I will change him to [facility] today, which he does not need to take with food.  He will get lab work today in follow-up after lab work in 1 year.  He received his influenza vaccination today.

## 2017-03-30 LAB — T-HELPER CELL (CD4) - (RCID CLINIC ONLY)
CD4 T CELL HELPER: 38 % (ref 33–55)
CD4 T Cell Abs: 820 /uL (ref 400–2700)

## 2017-03-31 LAB — HIV-1 RNA QUANT-NO REFLEX-BLD
HIV 1 RNA QUANT: NOT DETECTED {copies}/mL
HIV-1 RNA QUANT, LOG: NOT DETECTED {Log_copies}/mL

## 2017-04-03 LAB — HLA B*5701: HLA-B 5701 W/RFLX HLA-B HIGH: NEGATIVE

## 2017-07-31 ENCOUNTER — Telehealth: Payer: Self-pay | Admitting: Pharmacist

## 2017-07-31 NOTE — Telephone Encounter (Signed)
Joshua Davis from [facility] reached out to me to let me know that we had to send patient's [facility]orders[facility]iktarvy to [facility] due to her insurance changing. Just FYI.

## 2017-08-10 ENCOUNTER — Emergency Department (HOSPITAL_COMMUNITY)
Admission: EM | Admit: 2017-08-10 | Discharge: 2017-08-10 | Disposition: A | Discharge status: Home / Self Care | Payer: BLUE CROSS/BLUE SHIELD | Attending: Emergency Medicine | Admitting: Emergency Medicine

## 2017-08-10 ENCOUNTER — Encounter (HOSPITAL_COMMUNITY): Payer: Self-pay | Admitting: Emergency Medicine

## 2017-08-10 ENCOUNTER — Emergency Department (HOSPITAL_COMMUNITY): Payer: BLUE CROSS/BLUE SHIELD

## 2017-08-10 DIAGNOSIS — Y999 Unspecified external cause status: Secondary | ICD-10-CM | POA: Diagnosis not present

## 2017-08-10 DIAGNOSIS — Z21 Asymptomatic human immunodeficiency virus [HIV] infection status: Secondary | ICD-10-CM | POA: Insufficient documentation

## 2017-08-10 DIAGNOSIS — Y92410 Unspecified street and highway as the place of occurrence of the external cause: Secondary | ICD-10-CM | POA: Diagnosis not present

## 2017-08-10 DIAGNOSIS — Y939 Activity, unspecified: Secondary | ICD-10-CM | POA: Diagnosis not present

## 2017-08-10 DIAGNOSIS — Z87891 Personal history of nicotine dependence: Secondary | ICD-10-CM | POA: Diagnosis not present

## 2017-08-10 DIAGNOSIS — Z79899 Other long term (current) drug therapy: Secondary | ICD-10-CM | POA: Diagnosis not present

## 2017-08-10 DIAGNOSIS — S161XXA Strain of muscle, fascia and tendon at neck level, initial encounter: Secondary | ICD-10-CM | POA: Diagnosis not present

## 2017-08-10 DIAGNOSIS — S199XXA Unspecified injury of neck, initial encounter: Secondary | ICD-10-CM | POA: Diagnosis present

## 2017-08-10 MED ORDER — CYCLOBENZAPRINE HCL 10 MG PO TABS: 10.0000 mg | ORAL_TABLET | Freq: Two times a day (BID) | ORAL | 0 refills | Status: DC | PRN
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
## 2017-09-15 ENCOUNTER — Ambulatory Visit (HOSPITAL_COMMUNITY)
Admission: EM | Admit: 2017-09-15 | Discharge: 2017-09-15 | Disposition: A | Discharge status: Home / Self Care | Payer: Managed Care, Other (non HMO) | Attending: Family Medicine | Admitting: Family Medicine

## 2017-09-15 ENCOUNTER — Encounter (HOSPITAL_COMMUNITY): Payer: Self-pay | Admitting: Family Medicine

## 2017-09-15 DIAGNOSIS — M20011 Mallet finger of right finger(s): Secondary | ICD-10-CM | POA: Diagnosis not present

## 2017-09-15 DIAGNOSIS — R05 Cough: Secondary | ICD-10-CM | POA: Diagnosis not present

## 2017-09-15 DIAGNOSIS — R058 Other specified cough: Secondary | ICD-10-CM

## 2017-09-15 MED ORDER — BENZONATATE 100 MG PO CAPS: 100.0000 mg | ORAL_CAPSULE | Freq: Two times a day (BID) | ORAL | 0 refills | Status: DC | PRN

## 2017-09-15 MED ORDER — CYCLOBENZAPRINE HCL 10 MG PO TABS: 10.0000 mg | ORAL_TABLET | Freq: Two times a day (BID) | ORAL | 0 refills | Status: DC | PRN

## 2017-09-15 MED ORDER — DOXYCYCLINE HYCLATE 100 MG PO CAPS: 100.0000 mg | ORAL_CAPSULE | Freq: Two times a day (BID) | ORAL | 0 refills | Status: DC

## 2017-09-15 NOTE — ED Provider Notes (Signed)
[facility]    CSN: [id number] Arrival date & time: 09/15/17  1317     History   Chief Complaint Chief Complaint  Patient presents with  . Finger Injury  . Cough    HPI Joshua Davis is a 57 y.o. male.    HPI  Pleasant 57 year old man who is here for 2 reasons.  First, he reached underneath a cabinet in jammed his finger several days ago.  He had no pain.  Ever since then his finger has a droop at the distal joint.  He would like this evaluated. Second he has had a cough for over a month.  He thinks that it is from allergies.  He has been taking his allergy medicine.  He feels like it is from postnasal drip.  For the last several days, however, the cough is gotten worse.  He has more colored sputum, more fatigued, and a more congested feeling.  His chest hurts from the coughing.  The cough keeps him awake at night.  He thinks it may have developed into "bronchitis".  No fever or chills.  No sinus congestion or postnasal drip.  Past Medical History:  Diagnosis Date  . Anxiety   . HIV infection ([facility]) 04/11/1992    Patient Active Problem List   Diagnosis Date Noted  . Dyslipidemia 02/12/2015  . Hyperglycemia 02/12/2015  . Human immunodeficiency virus (HIV) disease ([facility]) 06/13/2014  . Anxiety disorder   . Elevated blood pressure reading without diagnosis of hypertension     History reviewed. No pertinent surgical history.     Home Medications    Prior to Admission medications   Medication Sig Start Date End Date Taking? Authorizing Provider  ALPRAZolam Prudy Feeler(XANAX) 0.5 MG tablet Take 0.5 mg by mouth 2 (two) times daily. 05/12/14   [provider]  amphetamine-dextroamphetamine (ADDERALL) 10 MG tablet Take 10 mg by mouth daily with breakfast.    [provider]  benzonatate (TESSALON) 100 MG capsule Take 1 capsule (100 mg total) by mouth 2 (two) times daily as needed for cough. 09/15/17   Eustace MooreNelson, Amal Renbarger Sue, MD  bictegravir-emtricitabine-tenofovir  AF (BIKTARVY) 50-200-25 MG TABS tablet Take 1 tablet by mouth daily. 03/29/17   Cliffton Astersampbell, John, MD  cyclobenzaprine (FLEXERIL) 10 MG tablet Take 1 tablet (10 mg total) by mouth 2 (two) times daily as needed for muscle spasms. 09/15/17   Eustace MooreNelson, Jamesia Linnen Sue, MD  doxycycline (VIBRAMYCIN) 100 MG capsule Take 1 capsule (100 mg total) by mouth 2 (two) times daily. 09/15/17   Eustace MooreNelson, Isak Sotomayor Sue, MD    Family History Family History  Problem Relation Age of Onset  . Cancer Mother 4368       Breast  . Diabetes Father   . Hypertension Father   . Alzheimer's disease Father   . Kidney disease Brother        Kidney transplant, [id number]    Social History Social History   Tobacco Use  . Smoking status: Former Smoker    Years: 20.00    Last attempt to quit: 04/11/2013    Years since quitting: 4.4  . Smokeless tobacco: Never Used  Substance Use Topics  . Alcohol use: Yes    Alcohol/week: 3.0 oz    Types: 5 Glasses of wine per week  . Drug use: No     Allergies   Azt [zidovudine]   Review of Systems Review of Systems  Constitutional: Negative for chills and fever.  HENT: Negative for ear pain and sore throat.  Eyes: Negative for pain and visual disturbance.  Respiratory: Positive for cough. Negative for shortness of breath.   Cardiovascular: Negative for chest pain and palpitations.  Gastrointestinal: Negative for abdominal pain and vomiting.  Genitourinary: Negative for dysuria and hematuria.  Musculoskeletal: Positive for arthralgias. Negative for back pain.  Skin: Negative for color change and rash.  Neurological: Negative for seizures and syncope.  All other systems reviewed and are negative.    Physical Exam Triage Vital Signs ED Triage Vitals  Enc Vitals Group     BP 09/15/17 1400 (!) 133/98     Pulse Rate 09/15/17 1400 87     Resp 09/15/17 1400 18     Temp 09/15/17 1400 98.5 F (36.9 C)     Temp src --      SpO2 09/15/17 1400 100 %     Weight --      Height --      Head  Circumference --      Peak Flow --      Pain Score 09/15/17 1359 0     Pain Loc --      Pain Edu? --      Excl. in GC? --    No data found.  Updated Vital Signs BP (!) 133/98   Pulse 87   Temp 98.5 F (36.9 C)   Resp 18   SpO2 100%       Physical Exam  Constitutional: He appears well-developed and well-nourished. No distress.  HENT:  Head: Normocephalic and atraumatic.  Right Ear: External ear normal.  Left Ear: External ear normal.  Mouth/Throat: Oropharynx is clear and moist.  Eyes: Pupils are equal, round, and reactive to light. Conjunctivae are normal.  Neck: Normal range of motion.  Cardiovascular: Normal rate, regular rhythm and normal heart sounds.  Pulmonary/Chest: Effort normal. No respiratory distress. He has no wheezes.  Central rhonchi, no wheeze  Abdominal: Soft. He exhibits no distension.  Musculoskeletal: Normal range of motion. He exhibits no edema.  Right hand long finger has a extensor lag at the DIP consistent with mallet finger, extensor tendon disruption  Lymphadenopathy:    He has no cervical adenopathy.  Neurological: He is alert.  Skin: Skin is warm and dry.     UC Treatments / Results  Labs (all labs ordered are listed, but only abnormal results are displayed) Labs Reviewed - No data to display  EKG None  Radiology No results found.  Procedures Procedures (including critical care time)  Medications Ordered in UC Medications - No data to display  Initial Impression / Assessment and Plan / UC Course  I have reviewed the triage vital signs and the nursing notes.  Pertinent labs & imaging results that were available during my care of the patient were reviewed by me and considered in my medical decision making (see chart for details).      Final Clinical Impressions(s) / UC Diagnoses   Final diagnoses:  Allergic cough  Mallet finger of right hand     Discharge Instructions     Wear splint at all times.  Keep finger in  extension.  Do not bend for any reason. Splint will be necessary for 6 to 8 weeks Follow-up with your primary care doctor  Take 1 week of antibiotic for the cough.  Take cough medicine as needed.  Push fluids.  Use humidifier if available.   ED Prescriptions    Medication Sig Dispense Auth. Provider   cyclobenzaprine (FLEXERIL) 10 MG tablet Take  1 tablet (10 mg total) by mouth 2 (two) times daily as needed for muscle spasms. 20 tablet Eustace Moore, MD   benzonatate (TESSALON) 100 MG capsule Take 1 capsule (100 mg total) by mouth 2 (two) times daily as needed for cough. 20 capsule Eustace Moore, MD   doxycycline (VIBRAMYCIN) 100 MG capsule Take 1 capsule (100 mg total) by mouth 2 (two) times daily. 14 capsule Eustace Moore, MD     Controlled Substance Prescriptions [place] Controlled Substance Registry consulted? Not Applicable   Eustace Moore, MD 09/15/17 1924

## 2017-09-15 NOTE — ED Triage Notes (Addendum)
Pt here for cough, nasal congestion, sore throat and drainage. sts hx of allergies. He has been taking Claritin. This has been x 1 week. Pt also has deformity to distal phalange  Right middle finger. This occurred after bending finger backwards. Denies any pain.

## 2017-09-15 NOTE — Discharge Instructions (Addendum)
Wear splint at all times.  Keep finger in extension.  Do not bend for any reason. Splint will be necessary for 6 to 8 weeks Follow-up with your primary care doctor  Take 1 week of antibiotic for the cough.  Take cough medicine as needed.  Push fluids.  Use humidifier if available.

## 2017-11-25 ENCOUNTER — Encounter (HOSPITAL_COMMUNITY): Payer: Self-pay | Admitting: Emergency Medicine

## 2017-11-25 ENCOUNTER — Ambulatory Visit (HOSPITAL_COMMUNITY)
Admission: EM | Admit: 2017-11-25 | Discharge: 2017-11-25 | Disposition: A | Discharge status: Home / Self Care | Payer: BLUE CROSS/BLUE SHIELD | Attending: Family Medicine | Admitting: Family Medicine

## 2017-11-25 DIAGNOSIS — H65192 Other acute nonsuppurative otitis media, left ear: Secondary | ICD-10-CM

## 2017-11-25 DIAGNOSIS — R030 Elevated blood-pressure reading, without diagnosis of hypertension: Secondary | ICD-10-CM

## 2017-11-25 MED ORDER — AMOXICILLIN 875 MG PO TABS: 875.0000 mg | ORAL_TABLET | Freq: Two times a day (BID) | ORAL | 0 refills | Status: AC

## 2017-11-25 NOTE — ED Triage Notes (Signed)
Pt c/o facial pain since yesterday, states his neck is sore on the left side, with L ear "feels like its closing up a little bit".

## 2017-11-25 NOTE — Discharge Instructions (Addendum)
Get plenty of rest and push fluids Antibiotic prescribed.  Take as directed and to completion Take OTC medication as needed for symptomatic relief Follow up with PCP if symptoms persist Return or go to ER if you have any new or worsening symptoms   Blood pressure elevated in office.  Please recheck in 24 hours.  If it continues to be greater than 140/90 please follow up with PCP for further evaluation and management.

## 2017-11-25 NOTE — ED Provider Notes (Signed)
[facility]   [id number] 11/25/17 Arrival Time: 1700   CC: Sinus congestion and left ear pain  SUBJECTIVE: History from: patient.  Omelia BlackwaterLindsey W Bewick is a 57 y.o. male hx significant for HIV who presents with abrupt onset of sinus congestion and left ear pain that began yesterday.  Denies positive sick exposure or precipitating event.  Has tried ibuprofen without relief.  Denies aggravating factors.  Reports hx of previous symptoms in the past.  Complains of associated sore throat, and LAD.  Denies fever, chills, fatigue, sinus pain, rhinorrhea, SOB, wheezing, chest pain, nausea, changes in bowel or bladder habits.    ROS: As per HPI.  Past Medical History:  Diagnosis Date  . Anxiety   . HIV infection (HCC) 04/11/1992   History reviewed. No pertinent surgical history. Allergies  Allergen Reactions  . Azt [Zidovudine] Other (See Comments)    fatigue   No current facility-administered medications on file prior to encounter.    Current Outpatient Medications on File Prior to Encounter  Medication Sig Dispense Refill  . ALPRAZolam (XANAX) 0.5 MG tablet Take 0.5 mg by mouth 2 (two) times daily.    Marland Kitchen. amphetamine-dextroamphetamine (ADDERALL) 10 MG tablet Take 10 mg by mouth daily with breakfast.    . bictegravir-emtricitabine-tenofovir AF (BIKTARVY) 50-200-25 MG TABS tablet Take 1 tablet by mouth daily. 30 tablet 11   Social History   Socioeconomic History  . Marital status: Single    Spouse name: Not on file  . Number of children: Not on file  . Years of education: Not on file  . Highest education level: Not on file  Occupational History  . Not on file  Social Needs  . Financial resource strain: Not on file  . Food insecurity:    Worry: Not on file    Inability: Not on file  . Transportation needs:    Medical: Not on file    Non-medical: Not on file  Tobacco Use  . Smoking status: Former Smoker    Years: 20.00    Last attempt to quit: 04/11/2013    Years since  quitting: 4.6  . Smokeless tobacco: Never Used  Substance and Sexual Activity  . Alcohol use: Yes    Alcohol/week: 5.0 standard drinks    Types: 5 Glasses of wine per week  . Drug use: No  . Sexual activity: Yes    Partners: Male    Birth control/protection: Condom    Comment: decined condoms  Lifestyle  . Physical activity:    Days per week: Not on file    Minutes per session: Not on file  . Stress: Not on file  Relationships  . Social connections:    Talks on phone: Not on file    Gets together: Not on file    Attends religious service: Not on file    Active member of club or organization: Not on file    Attends meetings of clubs or organizations: Not on file    Relationship status: Not on file  . Intimate partner violence:    Fear of current or ex partner: Not on file    Emotionally abused: Not on file    Physically abused: Not on file    Forced sexual activity: Not on file  Other Topics Concern  . Not on file  Social History Narrative  . Not on file   Family History  Problem Relation Age of Onset  . Cancer Mother [id number]       Breast  .  Diabetes Father   . Hypertension Father   . Alzheimer's disease Father   . Kidney disease Brother        Kidney transplant, 1994    OBJECTIVE:  Vitals:   11/25/17 1714 11/25/17 1736  BP: (!) 166/104 (!) 153/100  Pulse: 82   Resp: 18   Temp: 98 F (36.7 C)   SpO2: 100%      General appearance: alert; appears fatigued, nontoxic appearance  HEENT: Ears: EACs clear, RT TM pearly gray; LT TM bulging without obvious erythema, nontender with auricle or tragal manipulation Eyes: PERRL.  EOM grossly intact.  Sinuses nontender; Nose: mild clear rhinorrhea; tonsils nonerythematous, uvula midline Neck: supple without LAD Lungs: CTAB Heart: regular rate and rhythm.  Radial pulses 2+ symmetrical bilaterally Skin: warm and dry Psychological: alert and cooperative; normal mood and affect  ASSESSMENT & PLAN:  1. Other non-recurrent  acute nonsuppurative otitis media of left ear   2. Elevated blood pressure reading     Meds ordered this encounter  Medications  . amoxicillin (AMOXIL) 875 MG tablet    Sig: Take 1 tablet (875 mg total) by mouth 2 (two) times daily for 10 days.    Dispense:  20 tablet    Refill:  0    Order Specific Question:   Supervising Provider    Answer:   Isa RankinMURRAY, LAURA WILSON [[id number]]   Get plenty of rest and push fluids Antibiotic prescribed.  Take as directed and to completion Take OTC medication as needed for symptomatic relief Follow up with PCP if symptoms persist Return or go to ER if you have any new or worsening symptoms   Blood pressure elevated in office.  Please recheck in 24 hours.  If it continues to be greater than 140/90 please follow up with PCP for further evaluation and management.   Reviewed expectations re: course of current medical issues. Questions answered. Outlined signs and symptoms indicating need for more acute intervention. Patient verbalized understanding. After Visit Summary given.         Rennis HardingWurst, Cressie Betzler, PA-C 11/25/17 1857

## 2018-02-22 ENCOUNTER — Other Ambulatory Visit: Payer: Self-pay | Admitting: *Deleted

## 2018-02-22 DIAGNOSIS — B20 Human immunodeficiency virus [HIV] disease: Secondary | ICD-10-CM

## 2018-02-22 MED ORDER — BICTEGRAVIR-EMTRICITAB-TENOFOV 50-200-25 MG PO TABS: 1.0000 | ORAL_TABLET | Freq: Every day | ORAL | 5 refills | Status: DC

## 2018-03-02 ENCOUNTER — Encounter (HOSPITAL_COMMUNITY): Payer: Self-pay | Admitting: Emergency Medicine

## 2018-03-02 ENCOUNTER — Ambulatory Visit (HOSPITAL_COMMUNITY)
Admission: EM | Admit: 2018-03-02 | Discharge: 2018-03-02 | Disposition: A | Discharge status: Home / Self Care | Payer: Managed Care, Other (non HMO) | Attending: Family Medicine | Admitting: Family Medicine

## 2018-03-02 DIAGNOSIS — H6690 Otitis media, unspecified, unspecified ear: Secondary | ICD-10-CM | POA: Diagnosis not present

## 2018-03-02 MED ORDER — AMOXICILLIN 500 MG PO CAPS: 500.0000 mg | ORAL_CAPSULE | Freq: Two times a day (BID) | ORAL | 0 refills | Status: AC

## 2018-03-02 NOTE — Discharge Instructions (Signed)
Rest and drink plenty of fluids Prescribed amoxicillin.   Take medication as directed and to completion Continue to use OTC ibuprofen and/ or tylenol as needed for pain control Follow up with PCP if symptoms persists Return here or go to the ER if you have any new or worsening symptoms worsening ear pain, fever, chills, nausea, vomiting, cough, etc...Marland Kitchen

## 2018-03-02 NOTE — ED Provider Notes (Signed)
[facility]   [id number] 03/02/18 Arrival Time: 1921  CC:EAR PAIN  SUBJECTIVE: History from: patient.  Joshua Davis is a 57 y.o. male who presents with bilateral ear pain R>L for the past day or two.  Denies a precipitating event, such as swimming or wearing ear plugs.  Patient does recall having a recent URI.  Patient states the pain is constant and "pressure" in character.  Patient has tried OTC medications without relief.  Denies aggravating factors.  Reports similar symptoms in the past. Complains of associated decreased hearing and tinnitus.  Denies fever, chills, fatigue, sinus pain, rhinorrhea, ear discharge, sore throat, cough, chest pain, nausea, changes in bowel or bladder habits.    ROS: As per HPI.  Past Medical History:  Diagnosis Date  . Anxiety   . HIV infection (HCC) 04/11/1992   History reviewed. No pertinent surgical history. Allergies  Allergen Reactions  . Azt [Zidovudine] Other (See Comments)    fatigue   No current facility-administered medications on file prior to encounter.    Current Outpatient Medications on File Prior to Encounter  Medication Sig Dispense Refill  . ALPRAZolam (XANAX) 0.5 MG tablet Take 0.5 mg by mouth 2 (two) times daily.    Marland Kitchen amphetamine-dextroamphetamine (ADDERALL) 10 MG tablet Take 10 mg by mouth daily with breakfast.    . bictegravir-emtricitabine-tenofovir AF (BIKTARVY) 50-200-25 MG TABS tablet Take 1 tablet by mouth daily. 30 tablet 5   Social History   Socioeconomic History  . Marital status: Single    Spouse name: Not on file  . Number of children: Not on file  . Years of education: Not on file  . Highest education level: Not on file  Occupational History  . Not on file  Social Needs  . Financial resource strain: Not on file  . Food insecurity:    Worry: Not on file    Inability: Not on file  . Transportation needs:    Medical: Not on file    Non-medical: Not on file  Tobacco Use  . Smoking status:  Former Smoker    Years: 20.00    Last attempt to quit: 04/11/2013    Years since quitting: 4.8  . Smokeless tobacco: Never Used  Substance and Sexual Activity  . Alcohol use: Yes    Alcohol/week: 5.0 standard drinks    Types: 5 Glasses of wine per week  . Drug use: No  . Sexual activity: Yes    Partners: Male    Birth control/protection: Condom    Comment: decined condoms  Lifestyle  . Physical activity:    Days per week: Not on file    Minutes per session: Not on file  . Stress: Not on file  Relationships  . Social connections:    Talks on phone: Not on file    Gets together: Not on file    Attends religious service: Not on file    Active member of club or organization: Not on file    Attends meetings of clubs or organizations: Not on file    Relationship status: Not on file  . Intimate partner violence:    Fear of current or ex partner: Not on file    Emotionally abused: Not on file    Physically abused: Not on file    Forced sexual activity: Not on file  Other Topics Concern  . Not on file  Social History Narrative  . Not on file   Family History  Problem Relation Age of Onset  .  Cancer Mother [id number]       Breast  . Diabetes Father   . Hypertension Father   . Alzheimer's disease Father   . Kidney disease Brother        Kidney transplant, 1994    OBJECTIVE:  Vitals:   03/02/18 1941 03/02/18 1942  BP:  (!) 154/98  Pulse: 81   Resp: 16   Temp: 97.9 F (36.6 C)   SpO2: 100%      General appearance: alert; nontoxic appearance  HEENT: NCAT; Ears: EACs clear, LT TM pearly gray, LT TM mildly erythematous and tense; Eyes: PERRL.  EOM grossly intact.  Sinuses nontender; Nose: no obvious rhinorrhea; tonsils nonerythematous, uvula midline  Lungs: CTAB Heart: regular rate and rhythm.  Radial pulses 2+ symmetrical bilaterally Skin: warm and dry Psychological: alert and cooperative; normal mood and affect  ASSESSMENT & PLAN:  1. Acute otitis media, unspecified  otitis media type     Meds ordered this encounter  Medications  . amoxicillin (AMOXIL) 500 MG capsule    Sig: Take 1 capsule (500 mg total) by mouth 2 (two) times daily for 10 days.    Dispense:  20 capsule    Refill:  0    Order Specific Question:   Supervising Provider    Answer:   Isa RankinMURRAY, LAURA WILSON [[id number]]   Rest and drink plenty of fluids Prescribed amoxicillin.   Take medication as directed and to completion Continue to use OTC ibuprofen and/ or tylenol as needed for pain control Follow up with PCP if symptoms persists Return here or go to the ER if you have any new or worsening symptoms worsening ear pain, fever, chills, nausea, vomiting, cough, etc...  Reviewed expectations re: course of current medical issues. Questions answered. Outlined signs and symptoms indicating need for more acute intervention. Patient verbalized understanding. After Visit Summary given.         Rennis HardingWurst, Nehemiah Montee, PA-C 03/02/18 2022

## 2018-03-02 NOTE — ED Triage Notes (Signed)
Pt c/o R ear pain since yesterday.  

## 2018-03-15 ENCOUNTER — Other Ambulatory Visit: Payer: BLUE CROSS/BLUE SHIELD

## 2018-03-29 ENCOUNTER — Ambulatory Visit: Payer: BLUE CROSS/BLUE SHIELD | Admitting: Internal Medicine

## 2018-08-08 ENCOUNTER — Other Ambulatory Visit: Payer: Self-pay | Admitting: Internal Medicine

## 2018-08-08 DIAGNOSIS — B20 Human immunodeficiency virus [HIV] disease: Secondary | ICD-10-CM

## 2018-09-05 ENCOUNTER — Other Ambulatory Visit: Payer: Self-pay

## 2018-09-05 DIAGNOSIS — B20 Human immunodeficiency virus [HIV] disease: Secondary | ICD-10-CM

## 2018-09-05 MED ORDER — BICTEGRAVIR-EMTRICITAB-TENOFOV 50-200-25 MG PO TABS: 1.0000 | ORAL_TABLET | Freq: Every day | ORAL | 0 refills | Status: DC

## 2018-09-11 ENCOUNTER — Encounter (HOSPITAL_COMMUNITY): Payer: Self-pay

## 2018-09-11 ENCOUNTER — Ambulatory Visit (HOSPITAL_COMMUNITY)
Admission: EM | Admit: 2018-09-11 | Discharge: 2018-09-11 | Disposition: A | Discharge status: Home / Self Care | Payer: Managed Care, Other (non HMO) | Attending: Family Medicine | Admitting: Family Medicine

## 2018-09-11 ENCOUNTER — Other Ambulatory Visit: Payer: Self-pay

## 2018-09-11 DIAGNOSIS — S0501XA Injury of conjunctiva and corneal abrasion without foreign body, right eye, initial encounter: Secondary | ICD-10-CM | POA: Diagnosis not present

## 2018-09-11 MED ORDER — TETRACAINE HCL 0.5 % OP SOLN
OPHTHALMIC | Status: AC
  Filled 2018-09-11: qty 4

## 2018-09-11 MED ORDER — FLUORESCEIN SODIUM 1 MG OP STRP
ORAL_STRIP | OPHTHALMIC | Status: AC
  Filled 2018-09-11: qty 1

## 2018-09-11 MED ORDER — PROPYLENE GLYCOL 0.6 % OP SOLN: 2.0000 [drp] | Freq: Three times a day (TID) | OPHTHALMIC | 0 refills | Status: AC | PRN

## 2018-09-11 MED ORDER — ERYTHROMYCIN 5 MG/GM OP OINT: TOPICAL_OINTMENT | OPHTHALMIC | 0 refills | Status: AC

## 2018-09-11 NOTE — Discharge Instructions (Signed)
Use lubricant eyedrops up to 3 times daily as needed for additional relief. Apply ointment with clean hands nightly. Follow-up with your eye doctor if symptoms do not improve or worsen (change in vision, unable to see, severe eye pain).

## 2018-09-11 NOTE — ED Provider Notes (Signed)
[facility]    CSN: [id number] Arrival date & time: 09/11/18  1800     History   Chief Complaint Chief Complaint  Patient presents with  . Eye Pain    HPI Joshua Davis is a 58 y.o. male with history of hypertension and HIV presenting for acute concern right eye injection and pain.  Patient states this started with some discomfort last night, with worsening pain, watery discharge, redness, photophobia today.  States he has been doing a lot of work around the house, unsure if something got in it.  Patient states that there is increased irritation when he blinks.  Patient wears contacts, has removed his right contact since this morning.  Patient denies headache, change in vision, pain with extraocular movements, ear pain, jaw pain.  Patient denies history of ulcer, iritis, uveitis.  No known autoimmune disorders.    Past Medical History:  Diagnosis Date  . Anxiety   . HIV infection (HCC) 04/11/1992    Patient Active Problem List   Diagnosis Date Noted  . Dyslipidemia 02/12/2015  . Hyperglycemia 02/12/2015  . Human immunodeficiency virus (HIV) disease (HCC) 06/13/2014  . Anxiety disorder   . Elevated blood pressure reading without diagnosis of hypertension     History reviewed. No pertinent surgical history.     Home Medications    Prior to Admission medications   Medication Sig Start Date End Date Taking? Authorizing Provider  ALPRAZolam Prudy Feeler(XANAX) 0.5 MG tablet Take 0.5 mg by mouth 2 (two) times daily. 05/12/14   [provider]  amphetamine-dextroamphetamine (ADDERALL) 10 MG tablet Take 10 mg by mouth daily with breakfast.    [provider]  bictegravir-emtricitabine-tenofovir AF (BIKTARVY) 50-200-25 MG TABS tablet Take 1 tablet by mouth daily. 09/05/18   Cliffton Astersampbell, John, MD  erythromycin ophthalmic ointment Place a 1/2 inch ribbon of ointment into the lower eyelid. 09/11/18   Hall-Potvin, [place], PA-C  Propylene Glycol ([facility] LUBRICANT DROPS)  0.6 % SOLN Apply 2 drops to eye 3 (three) times daily as needed. 09/11/18   Hall-Potvin, [place], PA-C    Family History Family History  Problem Relation Age of Onset  . Cancer Mother 2568       Breast  . Diabetes Father   . Hypertension Father   . Alzheimer's disease Father   . Kidney disease Brother        Kidney transplant, [id number]    Social History Social History   Tobacco Use  . Smoking status: Former Smoker    Years: 20.00    Last attempt to quit: 04/11/2013    Years since quitting: 5.4  . Smokeless tobacco: Never Used  Substance Use Topics  . Alcohol use: Yes    Alcohol/week: 5.0 standard drinks    Types: 5 Glasses of wine per week  . Drug use: No     Allergies   Azt [zidovudine]   Review of Systems As per HPI   Physical Exam Triage Vital Signs ED Triage Vitals  Enc Vitals Group     BP 09/11/18 1813 (!) 163/102     Pulse Rate 09/11/18 1813 72     Resp 09/11/18 1813 18     Temp 09/11/18 1813 98.2 F (36.8 C)     Temp Source 09/11/18 1813 Oral     SpO2 09/11/18 1813 98 %     Weight 09/11/18 1811 175 lb (79.4 kg)     Height --      Head Circumference --  Peak Flow --      Pain Score 09/11/18 1811 8     Pain Loc --      Pain Edu? --      Excl. in GC? --    No data found.  Updated Vital Signs BP (!) 163/102 (BP Location: Left Arm)   Pulse 72   Temp 98.2 F (36.8 C) (Oral)   Resp 18   Wt 175 lb (79.4 kg)   SpO2 98%   BMI 24.07 kg/m   Visual Acuity Right Eye Distance: 20/100 Left Eye Distance: 20/30 Bilateral Distance: 20/30  Right Eye Near:   Left Eye Near:    Bilateral Near:     Physical Exam Constitutional:      General: He is not in acute distress.    Appearance: He is normal weight.  HENT:     Head: Normocephalic and atraumatic.     Right Ear: Tympanic membrane, ear canal and external ear normal.     Left Ear: Tympanic membrane, ear canal and external ear normal.     Nose: Nose normal. No congestion.     Mouth/Throat:      Mouth: Mucous membranes are moist.     Pharynx: Oropharynx is clear. No oropharyngeal exudate or posterior oropharyngeal erythema.  Eyes:     General: No scleral icterus.    Pupils: Pupils are equal, round, and reactive to light.     Comments: Right eye with significant conjunctival edema and injection, sparing limbus.  No crescentic shadow, nonpainful extraocular movements which are intact.  Patient has clear, watery discharge.  No periorbital edema, ecchymosis, foreign bodies identified.  Fluoroscopy seen uptake in inferior medial orbit and small, few scattered areas over cornea.  Cardiovascular:     Rate and Rhythm: Normal rate.  Pulmonary:     Effort: Pulmonary effort is normal.  Skin:    Coloration: Skin is not jaundiced or pale.  Neurological:     Mental Status: He is alert and oriented to person, place, and time.      UC Treatments / Results  Labs (all labs ordered are listed, but only abnormal results are displayed) Labs Reviewed - No data to display  EKG None  Radiology No results found.  Procedures Procedures (including critical care time)  Medications Ordered in UC Medications - No data to display  Initial Impression / Assessment and Plan / UC Course  I have reviewed the triage vital signs and the nursing notes.  Pertinent labs & imaging results that were available during my care of the patient were reviewed by me and considered in my medical decision making (see chart for details).     58 year old male with history of HIV, hypertension presenting for right eye pain and redness.  History and physical exam reassuring for corneal abrasion second to debris exposure.  Patient to try eye lubricant drops in conjunction with erythromycin ointment and avoid contact use for the until symptoms resolve with strict return precautions for patient's established eye care provider.  Patient verbalized understanding. Final Clinical Impressions(s) / UC Diagnoses   Final  diagnoses:  Abrasion of right cornea, initial encounter     Discharge Instructions     Use lubricant eyedrops up to 3 times daily as needed for additional relief. Apply ointment with clean hands nightly. Follow-up with your eye doctor if symptoms do not improve or worsen (change in vision, unable to see, severe eye pain).    ED Prescriptions    Medication Sig Dispense Auth.  Provider   erythromycin ophthalmic ointment Place a 1/2 inch ribbon of ointment into the lower eyelid. 3.5 g Hall-Potvin, [place], PA-C   Propylene Glycol ([facility] LUBRICANT DROPS) 0.6 % SOLN Apply 2 drops to eye 3 (three) times daily as needed. 10 mL Hall-Potvin, [place], PA-C     Controlled Substance Prescriptions  Controlled Substance Registry consulted? Not Applicable   Jetta Lout 09/11/18 1910

## 2018-09-11 NOTE — ED Triage Notes (Signed)
Pt cc he has pain in his right eye. Pt states it just happened all of a sudden today. Right eye is red and draining.

## 2018-09-13 ENCOUNTER — Other Ambulatory Visit: Payer: Self-pay

## 2018-09-13 ENCOUNTER — Other Ambulatory Visit: Payer: Managed Care, Other (non HMO)

## 2018-09-13 ENCOUNTER — Other Ambulatory Visit (HOSPITAL_COMMUNITY)
Admission: RE | Admit: 2018-09-13 | Discharge: 2018-09-13 | Disposition: A | Discharge status: Home / Self Care | Payer: Managed Care, Other (non HMO) | Source: Ambulatory Visit | Attending: Internal Medicine | Admitting: Internal Medicine

## 2018-09-13 DIAGNOSIS — B20 Human immunodeficiency virus [HIV] disease: Secondary | ICD-10-CM

## 2018-09-13 DIAGNOSIS — Z113 Encounter for screening for infections with a predominantly sexual mode of transmission: Secondary | ICD-10-CM

## 2018-09-13 DIAGNOSIS — Z79899 Other long term (current) drug therapy: Secondary | ICD-10-CM

## 2018-09-14 LAB — T-HELPER CELLS (CD4) COUNT (NOT AT ARMC)
CD4 % Helper T Cell: 40 % (ref 33–65)
CD4 T Cell Abs: 776 /uL (ref 400–1790)

## 2018-09-14 LAB — URINE CYTOLOGY ANCILLARY ONLY
Chlamydia: NEGATIVE
Neisseria Gonorrhea: NEGATIVE

## 2018-09-15 ENCOUNTER — Other Ambulatory Visit: Payer: Self-pay | Admitting: Internal Medicine

## 2018-09-15 DIAGNOSIS — B20 Human immunodeficiency virus [HIV] disease: Secondary | ICD-10-CM

## 2018-09-27 ENCOUNTER — Telehealth: Payer: Self-pay | Admitting: *Deleted

## 2018-09-27 LAB — HIV-1 RNA QUANT-NO REFLEX-BLD
HIV 1 RNA Quant: 22 copies/mL — ABNORMAL HIGH
HIV-1 RNA Quant, Log: 1.34 Log copies/mL — ABNORMAL HIGH

## 2018-09-27 LAB — LIPID PANEL
Cholesterol: 203 mg/dL — ABNORMAL HIGH (ref <200)
HDL: 41 mg/dL (ref >=40)
LDL Cholesterol (Calc): 128 mg/dL (calc) — ABNORMAL HIGH
Non-HDL Cholesterol (Calc): 162 mg/dL (calc) — ABNORMAL HIGH (ref <130)
Total CHOL/HDL Ratio: 5 (calc) — ABNORMAL HIGH (ref <5.0)
Triglycerides: 195 mg/dL — ABNORMAL HIGH (ref <150)

## 2018-09-27 LAB — COMPREHENSIVE METABOLIC PANEL
AG Ratio: 1.6 (calc) (ref 1.0–2.5)
ALT: 15 U/L (ref 9–46)
AST: 15 U/L (ref 10–35)
Albumin: 4.4 g/dL (ref 3.6–5.1)
Alkaline phosphatase (APISO): 66 U/L (ref 35–144)
BUN: 17 mg/dL (ref 7–25)
CO2: 25 mmol/L (ref 20–32)
Calcium: 9.4 mg/dL (ref 8.6–10.3)
Chloride: 107 mmol/L (ref 98–110)
Creat: 0.99 mg/dL (ref 0.70–1.33)
Globulin: 2.7 g/dL (calc) (ref 1.9–3.7)
Glucose, Bld: 119 mg/dL — ABNORMAL HIGH (ref 65–99)
Potassium: 4.2 mmol/L (ref 3.5–5.3)
Sodium: 140 mmol/L (ref 135–146)
Total Bilirubin: 0.6 mg/dL (ref 0.2–1.2)
Total Protein: 7.1 g/dL (ref 6.1–8.1)

## 2018-09-27 LAB — RPR: RPR Ser Ql: NONREACTIVE

## 2018-09-27 NOTE — Telephone Encounter (Signed)
[facility] calling for refill.  Patient has not been seen since 03/2017. He is scheduled for follow up with Dr Megan Salon on 6/24.  He received a 30 day supply 09/06/2018. RN notified pharmacy this medication will be refilled at his office visit.  They will be able to overnight it to him once they receive the prescription. Landis Gandy, RN

## 2018-10-03 ENCOUNTER — Other Ambulatory Visit: Payer: Self-pay

## 2018-10-03 ENCOUNTER — Ambulatory Visit (INDEPENDENT_AMBULATORY_CARE_PROVIDER_SITE_OTHER): Payer: Managed Care, Other (non HMO) | Admitting: Internal Medicine

## 2018-10-03 ENCOUNTER — Encounter: Payer: Self-pay | Admitting: Internal Medicine

## 2018-10-03 DIAGNOSIS — R030 Elevated blood-pressure reading, without diagnosis of hypertension: Secondary | ICD-10-CM

## 2018-10-03 DIAGNOSIS — B20 Human immunodeficiency virus [HIV] disease: Secondary | ICD-10-CM | POA: Diagnosis not present

## 2018-10-03 MED ORDER — BIKTARVY 50-200-25 MG PO TABS: 1.0000 | ORAL_TABLET | Freq: Every day | ORAL | 11 refills | Status: DC

## 2018-10-03 NOTE — Progress Notes (Signed)
Patient Active Problem List   Diagnosis Date Noted  . Human immunodeficiency virus (HIV) disease (HCC) 06/13/2014    Priority: High  . Dyslipidemia 02/12/2015  . Hyperglycemia 02/12/2015  . Anxiety disorder   . Elevated blood pressure reading without diagnosis of hypertension     Patient's Medications  New Prescriptions   No medications on file  Previous Medications   ALPRAZOLAM (XANAX) 0.5 MG TABLET    Take 0.5 mg by mouth 2 (two) times daily.   AMPHETAMINE-DEXTROAMPHETAMINE (ADDERALL) 10 MG TABLET    Take 10 mg by mouth daily with breakfast.   ERYTHROMYCIN OPHTHALMIC OINTMENT    Place a 1/2 inch ribbon of ointment into the lower eyelid.   PROPYLENE GLYCOL ([facility] LUBRICANT DROPS) 0.6 % SOLN    Apply 2 drops to eye 3 (three) times daily as needed.  Modified Medications   Modified Medication Previous Medication   BIKTARVY 50-200-25 MG TABS TABLET BIKTARVY 50-200-25 MG TABS tablet      Take 1 tablet by mouth daily.    TAKE 1 TABLET DAILY  Discontinued Medications   No medications on file    Subjective: Joshua Davis is in for his first visit in 18 months.  Fortunately he has had no problems obtaining, taking or tolerating his Biktarvy and denies missing any doses.  He is not on any new medications.  He is feeling well.  He has a little worried about his blood pressure being elevated.  He took a new job working for [name] last year.  He has been working from home during the COVID pandemic.  He walks his dog 3-4 times daily.  Review of Systems: Review of Systems  Constitutional: Negative for chills, diaphoresis and fever.  Gastrointestinal: Negative for abdominal pain, diarrhea, nausea and vomiting.    Past Medical History:  Diagnosis Date  . Anxiety   . HIV infection (HCC) 04/11/1992    Social History   Tobacco Use  . Smoking status: Former Smoker    Years: 20.00    Quit date: 04/11/2013    Years since quitting: 5.4  . Smokeless tobacco: Never Used  Substance Use  Topics  . Alcohol use: Yes    Alcohol/week: 5.0 standard drinks    Types: 5 Glasses of wine per week  . Drug use: No    Family History  Problem Relation Age of Onset  . Cancer Mother [id number]       Breast  . Diabetes Father   . Hypertension Father   . Alzheimer's disease Father   . Kidney disease Brother        Kidney transplant, 1994    Allergies  Allergen Reactions  . Azt [Zidovudine] Other (See Comments)    fatigue    Health Maintenance  Topic Date Due  . Hepatitis C Screening  1961/04/09  . TETANUS/TDAP  11/14/1981  . COLONOSCOPY  11/14/2012  . INFLUENZA VACCINE  11/10/2018  . HIV Screening  Completed    Objective:  Vitals:   10/03/18 1107  BP: (!) 137/94  Pulse: 66  Temp: 98.3 F (36.8 C)  TempSrc: Oral  SpO2: 98%  Weight: 179 lb (81.2 kg)  Height: 5\' 11"  (1.803 m)   Body mass index is 24.97 kg/m.  Physical Exam Constitutional:      Comments: He is in good spirits.  Cardiovascular:     Rate and Rhythm: Normal rate and regular rhythm.     Heart sounds: No murmur.  Pulmonary:  Effort: Pulmonary effort is normal.     Breath sounds: Normal breath sounds.  Abdominal:     Palpations: Abdomen is soft.     Tenderness: There is no abdominal tenderness.  Psychiatric:        Mood and Affect: Mood normal.     Lab Results Lab Results  Component Value Date   WBC 6.5 07/20/2016   HGB 16.1 07/20/2016   HCT 46.0 07/20/2016   MCV 92.4 07/20/2016   PLT 199 07/20/2016    Lab Results  Component Value Date   CREATININE 0.99 09/13/2018   BUN 17 09/13/2018   NA 140 09/13/2018   K 4.2 09/13/2018   CL 107 09/13/2018   CO2 25 09/13/2018    Lab Results  Component Value Date   ALT 15 09/13/2018   AST 15 09/13/2018   ALKPHOS 81 07/20/2016   BILITOT 0.6 09/13/2018    Lab Results  Component Value Date   CHOL 203 (H) 09/13/2018   HDL 41 09/13/2018   LDLCALC 128 (H) 09/13/2018   TRIG 195 (H) 09/13/2018   CHOLHDL 5.0 (H) 09/13/2018   Lab Results   Component Value Date   LABRPR NON-REACTIVE 09/13/2018   HIV 1 RNA Quant (copies/mL)  Date Value  09/13/2018 22 (H)  03/29/2017 <20 NOT DETECTED  07/20/2016 <20 NOT DETECTED   CD4 T Cell Abs (/uL)  Date Value  09/13/2018 776  03/29/2017 820  07/20/2016 900     Problem List Items Addressed This Visit      High   Human immunodeficiency virus (HIV) disease ([place])    His infection remains under very good long-term control.  He will get Biktarvy and follow-up after lab work in 1 year.      Relevant Medications   BIKTARVY 50-200-25 MG TABS tablet   Other Relevant Orders   CBC   T-helper cell (CD4)- ([facility] clinic only)   Comprehensive metabolic panel   Lipid panel   RPR   HIV-1 RNA quant-no reflex-bld     Unprioritized   Elevated blood pressure reading without diagnosis of hypertension    His blood pressure has been above goal on recent visits here.  I suggested that he follow-up with his PCP soon.           Joshua Bickers, MD [facility] [phone] pager   [phone] cell 10/03/2018, 11:29 AM

## 2018-10-03 NOTE — Assessment & Plan Note (Signed)
His blood pressure has been above goal on recent visits here.  I suggested that he follow-up with his PCP soon.

## 2018-10-03 NOTE — Assessment & Plan Note (Signed)
His infection remains under very good long-term control.  He will get Biktarvy and follow-up after lab work in 1 year.

## 2019-09-05 ENCOUNTER — Other Ambulatory Visit: Payer: Self-pay

## 2019-09-05 ENCOUNTER — Other Ambulatory Visit: Payer: 59

## 2019-09-05 DIAGNOSIS — B20 Human immunodeficiency virus [HIV] disease: Secondary | ICD-10-CM

## 2019-09-06 LAB — T-HELPER CELL (CD4) - (RCID CLINIC ONLY)
CD4 % Helper T Cell: 39 % (ref 33–65)
CD4 T Cell Abs: 735 /uL (ref 400–1790)

## 2019-09-07 LAB — LIPID PANEL
Cholesterol: 212 mg/dL — ABNORMAL HIGH (ref <200)
HDL: 34 mg/dL — ABNORMAL LOW (ref >=40)
LDL Cholesterol (Calc): 128 mg/dL (calc) — ABNORMAL HIGH
Non-HDL Cholesterol (Calc): 178 mg/dL (calc) — ABNORMAL HIGH (ref <130)
Total CHOL/HDL Ratio: 6.2 (calc) — ABNORMAL HIGH (ref <5.0)
Triglycerides: 352 mg/dL — ABNORMAL HIGH (ref <150)

## 2019-09-07 LAB — COMPREHENSIVE METABOLIC PANEL
AG Ratio: 1.9 (calc) (ref 1.0–2.5)
ALT: 17 U/L (ref 9–46)
AST: 14 U/L (ref 10–35)
Albumin: 4.4 g/dL (ref 3.6–5.1)
Alkaline phosphatase (APISO): 71 U/L (ref 35–144)
BUN: 21 mg/dL (ref 7–25)
CO2: 23 mmol/L (ref 20–32)
Calcium: 9.5 mg/dL (ref 8.6–10.3)
Chloride: 106 mmol/L (ref 98–110)
Creat: 0.95 mg/dL (ref 0.70–1.33)
Globulin: 2.3 g/dL (calc) (ref 1.9–3.7)
Glucose, Bld: 106 mg/dL — ABNORMAL HIGH (ref 65–99)
Potassium: 4.2 mmol/L (ref 3.5–5.3)
Sodium: 137 mmol/L (ref 135–146)
Total Bilirubin: 0.4 mg/dL (ref 0.2–1.2)
Total Protein: 6.7 g/dL (ref 6.1–8.1)

## 2019-09-07 LAB — CBC
HCT: 45.1 % (ref 38.5–50.0)
Hemoglobin: 16.1 g/dL (ref 13.2–17.1)
MCH: 32.7 pg (ref 27.0–33.0)
MCHC: 35.7 g/dL (ref 32.0–36.0)
MCV: 91.7 fL (ref 80.0–100.0)
MPV: 11.2 fL (ref 7.5–12.5)
Platelets: 214 10*3/uL (ref 140–400)
RBC: 4.92 10*6/uL (ref 4.20–5.80)
RDW: 12.3 % (ref 11.0–15.0)
WBC: 6.5 10*3/uL (ref 3.8–10.8)

## 2019-09-07 LAB — RPR: RPR Ser Ql: NONREACTIVE

## 2019-09-07 LAB — HIV-1 RNA QUANT-NO REFLEX-BLD
HIV 1 RNA Quant: <20 copies/mL
HIV-1 RNA Quant, Log: <1.3 Log copies/mL

## 2019-09-09 ENCOUNTER — Other Ambulatory Visit: Payer: Self-pay | Admitting: Internal Medicine

## 2019-09-09 DIAGNOSIS — B20 Human immunodeficiency virus [HIV] disease: Secondary | ICD-10-CM

## 2019-09-10 ENCOUNTER — Other Ambulatory Visit: Payer: Managed Care, Other (non HMO)

## 2019-09-19 ENCOUNTER — Encounter: Payer: Managed Care, Other (non HMO) | Admitting: Internal Medicine

## 2019-09-24 ENCOUNTER — Encounter: Payer: Managed Care, Other (non HMO) | Admitting: Internal Medicine

## 2019-10-24 ENCOUNTER — Encounter: Payer: Self-pay | Admitting: Internal Medicine

## 2019-10-24 ENCOUNTER — Ambulatory Visit (INDEPENDENT_AMBULATORY_CARE_PROVIDER_SITE_OTHER): Payer: 59 | Admitting: Internal Medicine

## 2019-10-24 ENCOUNTER — Other Ambulatory Visit: Payer: Self-pay

## 2019-10-24 DIAGNOSIS — E785 Hyperlipidemia, unspecified: Secondary | ICD-10-CM | POA: Diagnosis not present

## 2019-10-24 DIAGNOSIS — F411 Generalized anxiety disorder: Secondary | ICD-10-CM | POA: Diagnosis not present

## 2019-10-24 DIAGNOSIS — B20 Human immunodeficiency virus [HIV] disease: Secondary | ICD-10-CM | POA: Diagnosis not present

## 2019-10-24 NOTE — Assessment & Plan Note (Signed)
He has reviewed all of his lab results in my chart and knows that he needs to make some lifestyle adjustments.  I encouraged him to get more vigorous exercise.

## 2019-10-24 NOTE — Assessment & Plan Note (Signed)
His anxiety has worsened during the pandemic and as a result of stress related to work and the health issues of his parents.  He can see our behavioral health counselor here at any time if he would like to.

## 2019-10-24 NOTE — Progress Notes (Signed)
Patient Active Problem List   Diagnosis Date Noted  . Human immunodeficiency virus (HIV) disease (HCC) 06/13/2014    Priority: High  . Dyslipidemia 02/12/2015  . Hyperglycemia 02/12/2015  . Anxiety disorder   . Elevated blood pressure reading without diagnosis of hypertension     Patient's Medications  New Prescriptions   No medications on file  Previous Medications   ALPRAZOLAM (XANAX) 0.5 MG TABLET    Take 0.5 mg by mouth 2 (two) times daily.   AMPHETAMINE-DEXTROAMPHETAMINE (ADDERALL) 10 MG TABLET    Take 10 mg by mouth daily with breakfast.   BIKTARVY 50-200-25 MG TABS TABLET    TAKE 1 TABLET DAILY   ERYTHROMYCIN OPHTHALMIC OINTMENT    Place a 1/2 inch ribbon of ointment into the lower eyelid.   PROPYLENE GLYCOL ([employer] LUBRICANT DROPS) 0.6 % SOLN    Apply 2 drops to eye 3 (three) times daily as needed.  Modified Medications   No medications on file  Discontinued Medications   No medications on file    Subjective: Joshua Davis is in for his routine HIV follow-up visit.  He denies having any problems obtaining, taking or tolerating his Biktarvy and does not recall missing any doses.  He continues to work for [employer].  He is still working from home they are short staffed and it has been very stressful.  His father died last year of late stage dementia and it has been hard for his mother who lives in [place], to be just.  She is having some medical issues due to previous breast cancer.  He has been having more anxiety because of everything else happened in the last year and a half.  He does try to get out and walk his dog the end of the day.  He denies feeling depressed.  He has received the [employer] Covid vaccine.  Review of Systems: Review of Systems  Constitutional: Negative for fever and weight loss.  Respiratory: Negative for cough and shortness of breath.   Cardiovascular: Negative for chest pain.  Gastrointestinal: Negative for abdominal pain, diarrhea, nausea and vomiting.   Musculoskeletal: Positive for joint pain.  Psychiatric/Behavioral: Negative for depression. The patient is nervous/anxious.     Past Medical History:  Diagnosis Date  . Anxiety   . HIV infection (HCC) 04/11/1992    Social History   Tobacco Use  . Smoking status: Former Smoker    Years: 20.00    Quit date: 04/11/2013    Years since quitting: 6.5  . Smokeless tobacco: Never Used  Vaping Use  . Vaping Use: Never used  Substance Use Topics  . Alcohol use: Yes    Alcohol/week: 5.0 standard drinks    Types: 5 Glasses of wine per week  . Drug use: No    Family History  Problem Relation Age of Onset  . Cancer Mother 78       Breast  . Diabetes Father   . Hypertension Father   . Alzheimer's disease Father   . Kidney disease Brother        Kidney transplant, 1994    Allergies  Allergen Reactions  . Azt [Zidovudine] Other (See Comments)    fatigue    Health Maintenance  Topic Date Due  . Hepatitis C Screening  Never done  . COVID-19 Vaccine (1) Never done  . TETANUS/TDAP  Never done  . COLONOSCOPY  Never done  . INFLUENZA VACCINE  11/10/2019  . HIV Screening  Completed    Objective:  Vitals:   10/24/19 1629  BP: (!) 160/103  Pulse: 93  SpO2: 95%  Weight: 172 lb (78 kg)   Body mass index is 23.99 kg/m.  Physical Exam Constitutional:      Comments: His weight is stable.  Cardiovascular:     Rate and Rhythm: Regular rhythm.     Heart sounds: No murmur heard.   Pulmonary:     Effort: Pulmonary effort is normal.     Breath sounds: Normal breath sounds.  Abdominal:     General: There is no distension.     Palpations: Abdomen is soft. There is no mass.     Tenderness: There is no abdominal tenderness.  Musculoskeletal:        General: Tenderness present. No swelling.     Comments: He is having some left shoulder pain.  Skin:    Findings: No rash.  Neurological:     General: No focal deficit present.  Psychiatric:        Mood and Affect: Mood normal.      Lab Results Lab Results  Component Value Date   WBC 6.5 09/05/2019   HGB 16.1 09/05/2019   HCT 45.1 09/05/2019   MCV 91.7 09/05/2019   PLT 214 09/05/2019    Lab Results  Component Value Date   CREATININE 0.95 09/05/2019   BUN 21 09/05/2019   NA 137 09/05/2019   K 4.2 09/05/2019   CL 106 09/05/2019   CO2 23 09/05/2019    Lab Results  Component Value Date   ALT 17 09/05/2019   AST 14 09/05/2019   ALKPHOS 81 07/20/2016   BILITOT 0.4 09/05/2019    Lab Results  Component Value Date   CHOL 212 (H) 09/05/2019   HDL 34 (L) 09/05/2019   LDLCALC 128 (H) 09/05/2019   TRIG 352 (H) 09/05/2019   CHOLHDL 6.2 (H) 09/05/2019   Lab Results  Component Value Date   LABRPR NON-REACTIVE 09/05/2019   HIV 1 RNA Quant (copies/mL)  Date Value  09/05/2019 <20 NOT DETECTED  09/13/2018 22 (H)  03/29/2017 <20 NOT DETECTED   CD4 T Cell Abs (/uL)  Date Value  09/05/2019 735  09/13/2018 776  03/29/2017 820     Problem List Items Addressed This Visit      High   Human immunodeficiency virus (HIV) disease (HCC)    His infection remains under excellent, long-term control.  He will continue Biktarvy and follow-up after lab work in 1 year.        Unprioritized   Dyslipidemia    He has reviewed all of his lab results in my chart and knows that he needs to make some lifestyle adjustments.  I encouraged him to get more vigorous exercise.      Anxiety disorder    His anxiety has worsened during the pandemic and as a result of stress related to work and the health issues of his parents.  He can see our behavioral health counselor here at any time if he would like to.           Cliffton Asters, MD [facility] for Infectious Disease [facility] [phone] pager   [phone] cell 10/24/2019, 4:55 PM

## 2019-10-24 NOTE — Assessment & Plan Note (Signed)
His infection remains under excellent, long-term control.  He will continue Biktarvy and follow-up after lab work in 1 year. 
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
## 2019-12-20 ENCOUNTER — Other Ambulatory Visit: Payer: Self-pay | Admitting: Internal Medicine

## 2019-12-20 DIAGNOSIS — B20 Human immunodeficiency virus [HIV] disease: Secondary | ICD-10-CM

## 2020-07-08 ENCOUNTER — Other Ambulatory Visit: Payer: Self-pay | Admitting: Internal Medicine

## 2020-07-08 DIAGNOSIS — B20 Human immunodeficiency virus [HIV] disease: Secondary | ICD-10-CM

## 2020-10-09 ENCOUNTER — Other Ambulatory Visit: Payer: Self-pay

## 2020-10-09 DIAGNOSIS — Z113 Encounter for screening for infections with a predominantly sexual mode of transmission: Secondary | ICD-10-CM

## 2020-10-09 DIAGNOSIS — B20 Human immunodeficiency virus [HIV] disease: Secondary | ICD-10-CM

## 2020-10-09 DIAGNOSIS — Z79899 Other long term (current) drug therapy: Secondary | ICD-10-CM

## 2020-10-13 ENCOUNTER — Other Ambulatory Visit (HOSPITAL_COMMUNITY)
Admission: RE | Admit: 2020-10-13 | Discharge: 2020-10-13 | Disposition: A | Discharge status: Home / Self Care | Payer: 59 | Source: Ambulatory Visit | Attending: Internal Medicine | Admitting: Internal Medicine

## 2020-10-13 ENCOUNTER — Other Ambulatory Visit: Payer: Self-pay

## 2020-10-13 ENCOUNTER — Other Ambulatory Visit: Payer: 59

## 2020-10-13 DIAGNOSIS — Z113 Encounter for screening for infections with a predominantly sexual mode of transmission: Secondary | ICD-10-CM | POA: Diagnosis present

## 2020-10-13 DIAGNOSIS — Z79899 Other long term (current) drug therapy: Secondary | ICD-10-CM

## 2020-10-13 DIAGNOSIS — B20 Human immunodeficiency virus [HIV] disease: Secondary | ICD-10-CM

## 2020-10-14 LAB — URINE CYTOLOGY ANCILLARY ONLY
Chlamydia: NEGATIVE
Comment: NEGATIVE
Comment: NORMAL
Neisseria Gonorrhea: NEGATIVE

## 2020-10-14 LAB — T-HELPER CELL (CD4) - (RCID CLINIC ONLY)
CD4 % Helper T Cell: 39 % (ref 33–65)
CD4 T Cell Abs: 763 /uL (ref 400–1790)

## 2020-10-15 LAB — CBC WITH DIFFERENTIAL/PLATELET
Absolute Monocytes: 443 cells/uL (ref 200–950)
Basophils Absolute: 41 cells/uL (ref 0–200)
Basophils Relative: 0.7 %
Eosinophils Absolute: 142 cells/uL (ref 15–500)
Eosinophils Relative: 2.4 %
HCT: 45.9 % (ref 38.5–50.0)
Hemoglobin: 16 g/dL (ref 13.2–17.1)
Lymphs Abs: 2041 cells/uL (ref 850–3900)
MCH: 32.3 pg (ref 27.0–33.0)
MCHC: 34.9 g/dL (ref 32.0–36.0)
MCV: 92.7 fL (ref 80.0–100.0)
MPV: 11.2 fL (ref 7.5–12.5)
Monocytes Relative: 7.5 %
Neutro Abs: 3233 cells/uL (ref 1500–7800)
Neutrophils Relative %: 54.8 %
Platelets: 203 10*3/uL (ref 140–400)
RBC: 4.95 10*6/uL (ref 4.20–5.80)
RDW: 12.6 % (ref 11.0–15.0)
Total Lymphocyte: 34.6 %
WBC: 5.9 10*3/uL (ref 3.8–10.8)

## 2020-10-15 LAB — RPR: RPR Ser Ql: NONREACTIVE

## 2020-10-15 LAB — LIPID PANEL
Cholesterol: 207 mg/dL — ABNORMAL HIGH (ref <200)
HDL: 42 mg/dL (ref >=40)
LDL Cholesterol (Calc): 119 mg/dL (calc) — ABNORMAL HIGH
Non-HDL Cholesterol (Calc): 165 mg/dL (calc) — ABNORMAL HIGH (ref <130)
Total CHOL/HDL Ratio: 4.9 (calc) (ref <5.0)
Triglycerides: 325 mg/dL — ABNORMAL HIGH (ref <150)

## 2020-10-15 LAB — COMPREHENSIVE METABOLIC PANEL
AG Ratio: 1.7 (calc) (ref 1.0–2.5)
ALT: 16 U/L (ref 9–46)
AST: 13 U/L (ref 10–35)
Albumin: 4.2 g/dL (ref 3.6–5.1)
Alkaline phosphatase (APISO): 69 U/L (ref 35–144)
BUN: 17 mg/dL (ref 7–25)
CO2: 21 mmol/L (ref 20–32)
Calcium: 9.3 mg/dL (ref 8.6–10.3)
Chloride: 110 mmol/L (ref 98–110)
Creat: 0.97 mg/dL (ref 0.70–1.33)
Globulin: 2.5 g/dL (calc) (ref 1.9–3.7)
Glucose, Bld: 90 mg/dL (ref 65–99)
Potassium: 4.1 mmol/L (ref 3.5–5.3)
Sodium: 138 mmol/L (ref 135–146)
Total Bilirubin: 0.4 mg/dL (ref 0.2–1.2)
Total Protein: 6.7 g/dL (ref 6.1–8.1)

## 2020-10-15 LAB — HIV-1 RNA QUANT-NO REFLEX-BLD
HIV 1 RNA Quant: <20 Copies/mL — ABNORMAL HIGH
HIV-1 RNA Quant, Log: <1.3 Log cps/mL — ABNORMAL HIGH

## 2020-10-27 ENCOUNTER — Encounter: Payer: Self-pay | Admitting: Internal Medicine

## 2020-10-27 ENCOUNTER — Other Ambulatory Visit: Payer: Self-pay

## 2020-10-27 ENCOUNTER — Ambulatory Visit (INDEPENDENT_AMBULATORY_CARE_PROVIDER_SITE_OTHER): Payer: 59 | Admitting: Internal Medicine

## 2020-10-27 DIAGNOSIS — B20 Human immunodeficiency virus [HIV] disease: Secondary | ICD-10-CM

## 2020-10-27 MED ORDER — BIKTARVY 50-200-25 MG PO TABS: 1.0000 | ORAL_TABLET | Freq: Every day | ORAL | 11 refills | Status: DC

## 2020-10-27 NOTE — Progress Notes (Signed)
Patient Active Problem List   Diagnosis Date Noted   Human immunodeficiency virus (HIV) disease (HCC) 06/13/2014    Priority: High   Dyslipidemia 02/12/2015   Hyperglycemia 02/12/2015   Anxiety disorder    Elevated blood pressure reading without diagnosis of hypertension     Patient's Medications  New Prescriptions   No medications on file  Previous Medications   ALPRAZOLAM (XANAX) 0.5 MG TABLET    Take 0.5 mg by mouth 2 (two) times daily.   AMPHETAMINE-DEXTROAMPHETAMINE (ADDERALL) 10 MG TABLET    Take 10 mg by mouth daily with breakfast.   ERYTHROMYCIN OPHTHALMIC OINTMENT    Place a 1/2 inch ribbon of ointment into the lower eyelid.   PROPYLENE GLYCOL (CVS LUBRICANT DROPS) 0.6 % SOLN    Apply 2 drops to eye 3 (three) times daily as needed.  Modified Medications   Modified Medication Previous Medication   BICTEGRAVIR-EMTRICITABINE-TENOFOVIR AF (BIKTARVY) 50-200-25 MG TABS TABLET BIKTARVY 50-200-25 MG TABS tablet      Take 1 tablet by mouth daily.    TAKE 1 TABLET DAILY  Discontinued Medications   No medications on file    Subjective: Deny is in for his routine HIV follow-up visit.  He denies any problems obtaining, taking or tolerating his Biktarvy and does not recall missing any doses.  He says that work continues to be very stressful because [employer] cannot hire enough workers.  He tries to manage his stress by working in his garden and walking his dogs.  He is still helping care for his mother.  Review of Systems: Review of Systems  Constitutional:  Negative for fever and weight loss.  Respiratory:  Negative for cough.   Cardiovascular:  Negative for chest pain.  Psychiatric/Behavioral:  Negative for depression. The patient is nervous/anxious.    Past Medical History:  Diagnosis Date   Anxiety    HIV infection (HCC) 04/11/1992    Social History   Tobacco Use   Smoking status: Former    Years: 20.00    Types: Cigarettes    Quit date: 04/11/2013     Years since quitting: 7.5   Smokeless tobacco: Never  Vaping Use   Vaping Use: Never used  Substance Use Topics   Alcohol use: Yes    Alcohol/week: 5.0 standard drinks    Types: 5 Glasses of wine per week   Drug use: No    Family History  Problem Relation Age of Onset   Cancer Mother 48       Breast   Diabetes Father    Hypertension Father    Alzheimer's disease Father    Kidney disease Brother        Kidney transplant, 1994    Allergies  Allergen Reactions   Azt [Zidovudine] Other (See Comments)    fatigue    Health Maintenance  Topic Date Due   COVID-19 Vaccine (1) Never done   Hepatitis C Screening  Never done   TETANUS/TDAP  Never done   Zoster Vaccines- Shingrix (1 of 2) Never done   COLONOSCOPY (Pts 45-28yrs Insurance coverage will need to be confirmed)  Never done   INFLUENZA VACCINE  11/09/2020   Pneumococcal Vaccine 48-10 Years old (3 - PPSV23 or PCV20) 07/20/2021   HIV Screening  Completed   HPV VACCINES  Aged Out    Objective:  Vitals:   10/27/20 1603  Pulse: 97  Temp: 98 F (36.7 C)  TempSrc: Oral  SpO2: 95%  Weight: 186 lb (84.4 kg)   Body mass index is 25.94 kg/m.  Physical Exam Constitutional:      Comments: His spirits are good.  Cardiovascular:     Rate and Rhythm: Normal rate and regular rhythm.     Heart sounds: No murmur heard. Pulmonary:     Effort: Pulmonary effort is normal.     Breath sounds: Normal breath sounds.  Psychiatric:        Mood and Affect: Mood normal.    Lab Results Lab Results  Component Value Date   WBC 5.9 10/13/2020   HGB 16.0 10/13/2020   HCT 45.9 10/13/2020   MCV 92.7 10/13/2020   PLT 203 10/13/2020    Lab Results  Component Value Date   CREATININE 0.97 10/13/2020   BUN 17 10/13/2020   NA 138 10/13/2020   K 4.1 10/13/2020   CL 110 10/13/2020   CO2 21 10/13/2020    Lab Results  Component Value Date   ALT 16 10/13/2020   AST 13 10/13/2020   ALKPHOS 81 07/20/2016   BILITOT 0.4 10/13/2020     Lab Results  Component Value Date   CHOL 207 (H) 10/13/2020   HDL 42 10/13/2020   LDLCALC 119 (H) 10/13/2020   TRIG 325 (H) 10/13/2020   CHOLHDL 4.9 10/13/2020   Lab Results  Component Value Date   LABRPR NON-REACTIVE 10/13/2020   HIV 1 RNA Quant  Date Value  10/13/2020 <20 Copies/mL (H)  09/05/2019 <20 NOT DETECTED copies/mL  09/13/2018 22 copies/mL (H)   CD4 T Cell Abs (/uL)  Date Value  10/13/2020 763  09/05/2019 735  09/13/2018 776     Problem List Items Addressed This Visit       High   Human immunodeficiency virus (HIV) disease (HCC)    His infection remains under excellent, long-term control.  He will continue Biktarvy and follow-up after lab work in 1 year.       Relevant Medications   bictegravir-emtricitabine-tenofovir AF (BIKTARVY) 50-200-25 MG TABS tablet   Other Relevant Orders   CBC   T-helper cell (CD4)- ([facility] clinic only)   Comprehensive metabolic panel   Lipid panel   RPR   HIV-1 RNA quant-no reflex-bld      Cliffton Asters, MD [facility] for Infectious Disease [facility] [phone] pager   [phone] cell 10/27/2020, 4:15 PM

## 2020-10-27 NOTE — Assessment & Plan Note (Signed)
His infection remains under excellent, long-term control.  He will continue Biktarvy and follow-up after lab work in 1 year. 

## 2021-03-10 ENCOUNTER — Encounter (HOSPITAL_COMMUNITY): Payer: Self-pay | Admitting: *Deleted

## 2021-10-16 ENCOUNTER — Other Ambulatory Visit: Payer: Self-pay | Admitting: Internal Medicine

## 2021-10-16 DIAGNOSIS — B20 Human immunodeficiency virus [HIV] disease: Secondary | ICD-10-CM

## 2021-11-02 ENCOUNTER — Ambulatory Visit (INDEPENDENT_AMBULATORY_CARE_PROVIDER_SITE_OTHER): Payer: 59 | Admitting: Internal Medicine

## 2021-11-02 ENCOUNTER — Other Ambulatory Visit: Payer: Self-pay

## 2021-11-02 ENCOUNTER — Encounter: Payer: Self-pay | Admitting: Internal Medicine

## 2021-11-02 DIAGNOSIS — B20 Human immunodeficiency virus [HIV] disease: Secondary | ICD-10-CM | POA: Diagnosis not present

## 2021-11-02 MED ORDER — BIKTARVY 50-200-25 MG PO TABS: 1.0000 | ORAL_TABLET | Freq: Every day | ORAL | 11 refills | Status: DC

## 2021-11-02 NOTE — Assessment & Plan Note (Signed)
His infection has been under excellent, long-term control.  He will get lab work today, continue Radio producer and follow-up in 1 year.

## 2021-11-02 NOTE — Progress Notes (Signed)
Patient Active Problem List   Diagnosis Date Noted   Human immunodeficiency virus (HIV) disease (HCC) 06/13/2014    Priority: High   Dyslipidemia 02/12/2015   Hyperglycemia 02/12/2015   Anxiety disorder    Elevated blood pressure reading without diagnosis of hypertension     Patient's Medications  New Prescriptions   No medications on file  Previous Medications   ALPRAZOLAM (XANAX) 0.5 MG TABLET    Take 0.5 mg by mouth 2 (two) times daily.   AMPHETAMINE-DEXTROAMPHETAMINE (ADDERALL) 10 MG TABLET    Take 10 mg by mouth daily with breakfast.   ERYTHROMYCIN OPHTHALMIC OINTMENT    Place a 1/2 inch ribbon of ointment into the lower eyelid.   PROPYLENE GLYCOL ([employer] LUBRICANT DROPS) 0.6 % SOLN    Apply 2 drops to eye 3 (three) times daily as needed.  Modified Medications   Modified Medication Previous Medication   BICTEGRAVIR-EMTRICITABINE-TENOFOVIR AF (BIKTARVY) 50-200-25 MG TABS TABLET BIKTARVY 50-200-25 MG TABS tablet      Take 1 tablet by mouth daily.    TAKE 1 TABLET DAILY  Discontinued Medications   No medications on file    Subjective: Joshua Davis is here for his routine HIV follow-up visit.  He has not had any problems obtaining, taking or tolerating Biktarvy and does not miss doses.  He is not on any new medications since his last visit.  His mother had been ill and died in May 11, 2022.  He is the executor of her estate and has been consumed on a daily basis with trying to settle her affairs.  He continues to work for [employer].  He does not love the work but he loves the benefits.  Review of Systems: Review of Systems  Constitutional:  Negative for fever and weight loss.    Past Medical History:  Diagnosis Date   Anxiety    HIV infection (HCC) 04/11/1992    Social History   Tobacco Use   Smoking status: Former    Years: 20.00    Types: Cigarettes    Quit date: 04/11/2013    Years since quitting: 8.5   Smokeless tobacco: Former   Tobacco comments:    Occ  Vaping  Use   Vaping Use: Never used  Substance Use Topics   Alcohol use: Yes    Alcohol/week: 5.0 standard drinks of alcohol    Types: 5 Glasses of wine per week   Drug use: No    Family History  Problem Relation Age of Onset   Cancer Mother 21       Breast   Diabetes Father    Hypertension Father    Alzheimer's disease Father    Kidney disease Brother        Kidney transplant, 1994    Allergies  Allergen Reactions   Azt [Zidovudine] Other (See Comments)    fatigue    Health Maintenance  Topic Date Due   COVID-19 Vaccine (1) Never done   Hepatitis C Screening  Never done   TETANUS/TDAP  Never done   Zoster Vaccines- Shingrix (1 of 2) Never done   COLONOSCOPY (Pts 45-34yrs Insurance coverage will need to be confirmed)  Never done   INFLUENZA VACCINE  11/09/2021   HIV Screening  Completed   HPV VACCINES  Aged Out    Objective:  Vitals:   11/02/21 1530  BP: (!) 152/107  Pulse: 83  Temp: 98.3 F (36.8 C)  TempSrc: Temporal  SpO2: 95%  Weight:  186 lb (84.4 kg)   Body mass index is 25.94 kg/m.  Physical Exam Constitutional:      Comments: He is in good spirits.  Cardiovascular:     Rate and Rhythm: Normal rate.  Pulmonary:     Effort: Pulmonary effort is normal.  Psychiatric:        Mood and Affect: Mood normal.     Lab Results Lab Results  Component Value Date   WBC 5.9 10/13/2020   HGB 16.0 10/13/2020   HCT 45.9 10/13/2020   MCV 92.7 10/13/2020   PLT 203 10/13/2020    Lab Results  Component Value Date   CREATININE 0.97 10/13/2020   BUN 17 10/13/2020   NA 138 10/13/2020   K 4.1 10/13/2020   CL 110 10/13/2020   CO2 21 10/13/2020    Lab Results  Component Value Date   ALT 16 10/13/2020   AST 13 10/13/2020   ALKPHOS 81 07/20/2016   BILITOT 0.4 10/13/2020    Lab Results  Component Value Date   CHOL 207 (H) 10/13/2020   HDL 42 10/13/2020   LDLCALC 119 (H) 10/13/2020   TRIG 325 (H) 10/13/2020   CHOLHDL 4.9 10/13/2020   Lab Results   Component Value Date   LABRPR NON-REACTIVE 10/13/2020   HIV 1 RNA Quant  Date Value  10/13/2020 <20 Copies/mL (H)  09/05/2019 <20 NOT DETECTED copies/mL  09/13/2018 22 copies/mL (H)   CD4 T Cell Abs (/uL)  Date Value  10/13/2020 763  09/05/2019 735  09/13/2018 776     Problem List Items Addressed This Visit       High   Human immunodeficiency virus (HIV) disease (HCC)    His infection has been under excellent, long-term control.  He will get lab work today, continue Radio producer and follow-up in 1 year.      Relevant Medications   bictegravir-emtricitabine-tenofovir AF (BIKTARVY) 50-200-25 MG TABS tablet   Other Relevant Orders   T-helper cells (CD4) count (not at [facility])   HIV-1 RNA quant-no reflex-bld   CBC   Comprehensive metabolic panel   RPR   Lipid panel      Cliffton Asters, MD [facility] for Infectious Disease [facility] [phone] pager   [phone] cell 11/02/2021, 3:50 PM

## 2021-11-03 LAB — T-HELPER CELLS (CD4) COUNT (NOT AT ARMC)
CD4 % Helper T Cell: 40 % (ref 33–65)
CD4 T Cell Abs: 861 /uL (ref 400–1790)

## 2021-11-05 LAB — LIPID PANEL
Cholesterol: 220 mg/dL — ABNORMAL HIGH (ref <200)
HDL: 44 mg/dL (ref >=40)
LDL Cholesterol (Calc): 140 mg/dL (calc) — ABNORMAL HIGH
Non-HDL Cholesterol (Calc): 176 mg/dL (calc) — ABNORMAL HIGH (ref <130)
Total CHOL/HDL Ratio: 5 (calc) — ABNORMAL HIGH (ref <5.0)
Triglycerides: 222 mg/dL — ABNORMAL HIGH (ref <150)

## 2021-11-05 LAB — CBC
HCT: 47 % (ref 38.5–50.0)
Hemoglobin: 16.3 g/dL (ref 13.2–17.1)
MCH: 32.4 pg (ref 27.0–33.0)
MCHC: 34.7 g/dL (ref 32.0–36.0)
MCV: 93.4 fL (ref 80.0–100.0)
MPV: 10.8 fL (ref 7.5–12.5)
Platelets: 205 10*3/uL (ref 140–400)
RBC: 5.03 10*6/uL (ref 4.20–5.80)
RDW: 12.8 % (ref 11.0–15.0)
WBC: 7 10*3/uL (ref 3.8–10.8)

## 2021-11-05 LAB — COMPREHENSIVE METABOLIC PANEL
AG Ratio: 1.6 (calc) (ref 1.0–2.5)
ALT: 19 U/L (ref 9–46)
AST: 15 U/L (ref 10–35)
Albumin: 4.4 g/dL (ref 3.6–5.1)
Alkaline phosphatase (APISO): 77 U/L (ref 35–144)
BUN: 17 mg/dL (ref 7–25)
CO2: 24 mmol/L (ref 20–32)
Calcium: 9.9 mg/dL (ref 8.6–10.3)
Chloride: 105 mmol/L (ref 98–110)
Creat: 1.06 mg/dL (ref 0.70–1.30)
Globulin: 2.7 g/dL (calc) (ref 1.9–3.7)
Glucose, Bld: 100 mg/dL — ABNORMAL HIGH (ref 65–99)
Potassium: 4.4 mmol/L (ref 3.5–5.3)
Sodium: 139 mmol/L (ref 135–146)
Total Bilirubin: 0.6 mg/dL (ref 0.2–1.2)
Total Protein: 7.1 g/dL (ref 6.1–8.1)

## 2021-11-05 LAB — HIV-1 RNA QUANT-NO REFLEX-BLD
HIV 1 RNA Quant: NOT DETECTED Copies/mL
HIV-1 RNA Quant, Log: NOT DETECTED Log cps/mL

## 2021-11-05 LAB — RPR: RPR Ser Ql: NONREACTIVE

## 2022-09-28 ENCOUNTER — Telehealth: Payer: Self-pay

## 2022-09-28 NOTE — Telephone Encounter (Signed)
Receive faxed refill request from [facility] for Biktarvy. Patient is due for follow up in July. Is not scheduled with provider to continue care. Left voicemail requesting call back. Juanita Laster, RMA

## 2022-10-17 ENCOUNTER — Other Ambulatory Visit: Payer: Self-pay

## 2022-10-17 DIAGNOSIS — B20 Human immunodeficiency virus [HIV] disease: Secondary | ICD-10-CM

## 2022-10-17 MED ORDER — BIKTARVY 50-200-25 MG PO TABS: 1.0000 | ORAL_TABLET | Freq: Every day | ORAL | 0 refills | Status: DC

## 2022-11-01 ENCOUNTER — Ambulatory Visit: Payer: 59 | Admitting: Internal Medicine

## 2022-11-10 ENCOUNTER — Encounter: Payer: Self-pay | Admitting: Internal Medicine

## 2022-11-10 ENCOUNTER — Ambulatory Visit (INDEPENDENT_AMBULATORY_CARE_PROVIDER_SITE_OTHER): Payer: 59 | Admitting: Internal Medicine

## 2022-11-10 ENCOUNTER — Other Ambulatory Visit: Payer: Self-pay

## 2022-11-10 ENCOUNTER — Other Ambulatory Visit (HOSPITAL_COMMUNITY)
Admission: RE | Admit: 2022-11-10 | Discharge: 2022-11-10 | Disposition: A | Discharge status: Home / Self Care | Payer: 59 | Source: Ambulatory Visit | Attending: Internal Medicine | Admitting: Internal Medicine

## 2022-11-10 VITALS — BP 150/103 | HR 72 | Temp 98.3°F | Resp 16 | Wt 178.0 lb

## 2022-11-10 DIAGNOSIS — Z111 Encounter for screening for respiratory tuberculosis: Secondary | ICD-10-CM | POA: Diagnosis not present

## 2022-11-10 DIAGNOSIS — Z113 Encounter for screening for infections with a predominantly sexual mode of transmission: Secondary | ICD-10-CM | POA: Diagnosis present

## 2022-11-10 DIAGNOSIS — Z1159 Encounter for screening for other viral diseases: Secondary | ICD-10-CM

## 2022-11-10 DIAGNOSIS — B20 Human immunodeficiency virus [HIV] disease: Secondary | ICD-10-CM

## 2022-11-10 NOTE — Patient Instructions (Signed)
Will do hiv and std and hepatitis screen labs along with tb screening    Please think about anal pap smear. We can talk about during next visit    See me in a year

## 2022-11-10 NOTE — Progress Notes (Signed)
Patient Active Problem List   Diagnosis Date Noted   Dyslipidemia 02/12/2015   Hyperglycemia 02/12/2015   Human immunodeficiency virus (HIV) disease (HCC) 06/13/2014   Anxiety disorder    Elevated blood pressure reading without diagnosis of hypertension     Patient's Medications  New Prescriptions   No medications on file  Previous Medications   ALPRAZOLAM (XANAX) 0.5 MG TABLET    Take 0.5 mg by mouth 2 (two) times daily.   AMPHETAMINE-DEXTROAMPHETAMINE (ADDERALL) 10 MG TABLET    Take 10 mg by mouth daily with breakfast.   BICTEGRAVIR-EMTRICITABINE-TENOFOVIR AF (BIKTARVY) 50-200-25 MG TABS TABLET    Take 1 tablet by mouth daily.   ERYTHROMYCIN OPHTHALMIC OINTMENT    Place a 1/2 inch ribbon of ointment into the lower eyelid.   LOSARTAN (COZAAR) 25 MG TABLET    Take 25 mg by mouth daily.   PROPYLENE GLYCOL ([facility]S LUBRICANT DROPS) 0.6 % SOLN    Apply 2 drops to eye 3 (three) times daily as needed.   ROSUVASTATIN (CRESTOR) 5 MG TABLET    Take 5 mg by mouth daily.  Modified Medications   No medications on file  Discontinued Medications   No medications on file    Subjective: Joshua Davis is here for his routine HIV follow-up visit.  He has not had any problems obtaining, taking or tolerating Biktarvy and does not miss doses.  He is not on any new medications since his last visit.  His mother had been ill and died in 05-10-2022.  He is the executor of her estate and has been consumed on a daily basis with trying to settle her affairs.  He continues to work for [employer].  He does not love the work but he loves the benefits.   11/10/22 [facility] clinic f/u  #hiv Dx'ed 1994; msm No hx OI and never met criteria for AIDS Therapy  [facility]ed [facility]; started seeing [facility] 2016 2018-c biktarvy 2018 genvoya Prior therapy -- zidovudine, tenofovir, atripla  Last seen dr Orvan Falconer 10/2021 No issues with biktarvy or missed dose  #social Lives alone with his pet dog Working -- [employer] Cigarette here and there with [employer]. No substance use Not sexually active/in relationship   He does want full std screening   Talkd about anal pap smear today he wants to wait on that    Review of Systems: ROS All other ros negative   Past Medical History:  Diagnosis Date   Anxiety    HIV infection (HCC) 04/11/1992    Social History   Tobacco Use   Smoking status: Former    Current packs/day: 0.00    Types: Cigarettes    Start date: 04/11/1993    Quit date: 04/11/2013    Years since quitting: 9.5   Smokeless tobacco: Former   Tobacco comments:    Occ  Vaping Use   Vaping status: Never Used  Substance Use Topics   Alcohol use: Yes    Alcohol/week: 5.0 standard drinks of alcohol    Types: 5 Glasses of wine per week   Drug use: No    Family History  Problem Relation Age of Onset   Cancer Mother 76       Breast   Diabetes Father    Hypertension Father    Alzheimer's disease Father    Kidney disease Brother        Kidney transplant, 1994    Allergies  Allergen Reactions  Azt [Zidovudine] Other (See Comments)    fatigue    Health Maintenance  Topic Date Due   COVID-19 Vaccine (1) Never done   Hepatitis C Screening  Never done   DTaP/Tdap/Td (1 - Tdap) Never done   Zoster Vaccines- Shingrix (1 of 2) Never done   Colonoscopy  Never done   INFLUENZA VACCINE  11/10/2022   HIV Screening  Completed   HPV VACCINES  Aged Out    Objective:  Vitals:   11/10/22 1556  Weight: 178 lb (80.7 kg)   Body mass index is 24.83 kg/m.  Physical Exam Constitutional:      Comments: He is in good spirits.  Cardiovascular:     Rate and Rhythm: Normal rate.  Pulmonary:     Effort: Pulmonary effort is normal.  Psychiatric:        Mood and Affect: Mood normal.     Lab Results Lab Results  Component Value Date   WBC 7.0 11/02/2021   HGB 16.3 11/02/2021   HCT 47.0 11/02/2021   MCV 93.4 11/02/2021   PLT 205 11/02/2021    Lab Results  Component  Value Date   CREATININE 1.06 11/02/2021   BUN 17 11/02/2021   NA 139 11/02/2021   K 4.4 11/02/2021   CL 105 11/02/2021   CO2 24 11/02/2021    Lab Results  Component Value Date   ALT 19 11/02/2021   AST 15 11/02/2021   ALKPHOS 81 07/20/2016   BILITOT 0.6 11/02/2021    Lab Results  Component Value Date   CHOL 220 (H) 11/02/2021   HDL 44 11/02/2021   LDLCALC 140 (H) 11/02/2021   TRIG 222 (H) 11/02/2021   CHOLHDL 5.0 (H) 11/02/2021   Lab Results  Component Value Date   LABRPR NON-REACTIVE 11/02/2021   HIV 1 RNA Quant  Date Value  11/02/2021 Not Detected Copies/mL  10/13/2020 <20 Copies/mL (H)  09/05/2019 <20 NOT DETECTED copies/mL   CD4 T Cell Abs (/uL)  Date Value  11/02/2021 861  10/13/2020 763  09/05/2019 735     Problem List Items Addressed This Visit   None Visit Diagnoses     HIV disease (HCC)    -  Primary   Relevant Orders   HIV 1 RNA quant-no reflex-bld   CBC   COMPLETE METABOLIC PANEL WITH GFR   T-helper cells (CD4) count   Screening-pulmonary TB       Relevant Orders   QuantiFERON-TB Gold Plus   Screening for STDs (sexually transmitted diseases)       Relevant Orders   Cytology (oral, anal, urethral) ancillary only   Cytology (oral, anal, urethral) ancillary only   Urine cytology ancillary only([place])   T.pallidum Ab, Total   RPR   Need for hepatitis B screening test       Relevant Orders   Hepatitis B surface antibody,quantitative   Hepatitis B Surface AntiGEN   Hepatitis B Core Antibody, total   Need for hepatitis C screening test       Relevant Orders   Hepatitis C Antibody        #hiv Doing well on biktarvy    -discussed u=u -encourage compliance -continue current HIV medication -labs today -f/u in 1 year     #htn/hlp On crestor and will continue    #hcm -vaccination Will reviewed -hepatitis screen B and c screen today -std screen Rpr tppa and triple screen today -tb screen Quantiferon gold  today -cancer screen He wants to defer  anal pap today Patient sees [facility] Had colonoscopy last year 2023 some polyps due for repeat in 3 years    Raymondo Band, MD [facility] for Infectious Disease [facility] [phone] pager   [phone] cell 11/10/2022, 3:58 PM

## 2022-12-20 ENCOUNTER — Other Ambulatory Visit: Payer: Self-pay | Admitting: Internal Medicine

## 2022-12-20 DIAGNOSIS — B20 Human immunodeficiency virus [HIV] disease: Secondary | ICD-10-CM

## 2022-12-27 ENCOUNTER — Encounter: Payer: Self-pay | Admitting: Internal Medicine

## 2023-06-19 ENCOUNTER — Other Ambulatory Visit: Payer: Self-pay | Admitting: Internal Medicine

## 2023-06-19 DIAGNOSIS — B20 Human immunodeficiency virus [HIV] disease: Secondary | ICD-10-CM

## 2023-08-24 NOTE — Progress Notes (Signed)
 The 10-year ASCVD risk score (Arnett DK, et al., 2019) is: 26.4%   Values used to calculate the score:     Age: 63 years     Sex: Male     Is Non-Hispanic African American: No     Diabetic: No     Tobacco smoker: Yes     Systolic Blood Pressure: 159 mmHg     Is BP treated: Yes     HDL Cholesterol: 44 mg/dL     Total Cholesterol: 220 mg/dL  Currently prescribed rosuvastatin 5 mg.   Esti Demello, BSN, RN

## 2023-11-02 ENCOUNTER — Ambulatory Visit: Payer: 59 | Admitting: Internal Medicine

## 2023-11-20 ENCOUNTER — Other Ambulatory Visit: Payer: Self-pay | Admitting: Internal Medicine

## 2023-11-20 DIAGNOSIS — B20 Human immunodeficiency virus [HIV] disease: Secondary | ICD-10-CM

## 2023-11-20 NOTE — Telephone Encounter (Signed)
 Overdue for appt. Left vm

## 2023-12-14 ENCOUNTER — Other Ambulatory Visit (HOSPITAL_COMMUNITY)
Admission: RE | Admit: 2023-12-14 | Discharge: 2023-12-14 | Disposition: A | Discharge status: Home / Self Care | Source: Ambulatory Visit | Attending: Infectious Diseases | Admitting: Infectious Diseases

## 2023-12-14 ENCOUNTER — Encounter: Payer: Self-pay | Admitting: Infectious Diseases

## 2023-12-14 ENCOUNTER — Ambulatory Visit (INDEPENDENT_AMBULATORY_CARE_PROVIDER_SITE_OTHER): Admitting: Infectious Diseases

## 2023-12-14 ENCOUNTER — Other Ambulatory Visit: Payer: Self-pay

## 2023-12-14 VITALS — BP 145/93 | HR 73 | Temp 97.9°F | Ht 71.0 in | Wt 176.0 lb

## 2023-12-14 DIAGNOSIS — Z79899 Other long term (current) drug therapy: Secondary | ICD-10-CM | POA: Diagnosis not present

## 2023-12-14 DIAGNOSIS — B20 Human immunodeficiency virus [HIV] disease: Secondary | ICD-10-CM | POA: Diagnosis not present

## 2023-12-14 DIAGNOSIS — Z0000 Encounter for general adult medical examination without abnormal findings: Secondary | ICD-10-CM | POA: Insufficient documentation

## 2023-12-14 DIAGNOSIS — Z113 Encounter for screening for infections with a predominantly sexual mode of transmission: Secondary | ICD-10-CM | POA: Diagnosis not present

## 2023-12-14 DIAGNOSIS — Z23 Encounter for immunization: Secondary | ICD-10-CM | POA: Diagnosis not present

## 2023-12-14 MED ORDER — BIKTARVY 50-200-25 MG PO TABS
1.0000 | ORAL_TABLET | Freq: Every day | ORAL | 11 refills | Status: DC
Start: 2023-12-14 — End: 2023-12-15

## 2023-12-14 NOTE — Progress Notes (Signed)
 [address], [place], [place], [address]                                                                  Phn. [phone]; Fax: [phone]                                                                             Date: 12/14/23  Reason for Visit: Routine HIV care.   HPI: Joshua Davis is a 63 y.o.old male with a history of Anxiety, HIV who is here for HIV fu. Patient previously seen by Dr Elaine for a long time, last visit with Dr Overton in 11/2022.   Interval hx/current visit Reports consistently taking Biktarvy  without missed doses or concerns. Denies any recent sexual activity and previously preferred male partners. He occasionally consumes alcohol and smokes cigarettes infrequently. He is working. Willing to get screened for STDs, Anal pap and PCV 20 vaccine. He follows PCP. Prefers to keep 1 year fu.   No complaints today.   ROS: As stated in above HPI; all other systems were reviewed and are otherwise negative unless noted below  No reported fever / chills, night sweats, unintentional weight loss, acute visual change, odynophagia, chest pain/pressure, new or worsened SOB or WOB, nausea, vomiting, diarrhea, dysuria, GU discharge, syncope, seizures, red/hot swollen joints, hallucinations / delusions, rashes, new allergies, unusual / excessive bleeding, swollen lymph nodes, or new hospitalizations/ED visits/Urgent Care visits since the pt was last seen.  PMH/ PSH/ FamHx / Social Hx , medications and allergies reviewed and updated as appropriate; please see corresponding tab in EHR / prior notes                                        Current Outpatient Medications on File Prior to Visit  Medication Sig Dispense Refill   ALPRAZolam  (XANAX ) 0.5 MG tablet Take 0.5 mg by mouth 2 (two) times  daily.     amphetamine-dextroamphetamine (ADDERALL) 10 MG tablet Take 10 mg by mouth daily with breakfast.     erythromycin  ophthalmic ointment Place a 1/2 inch ribbon of ointment into the lower eyelid. (Patient not taking: Reported on 11/02/2021) 3.5 g 0   losartan (COZAAR) 25 MG tablet Take 25 mg by mouth daily.     Propylene Glycol ([employer] LUBRICANT DROPS) 0.6 % SOLN Apply 2 drops to eye 3 (three) times daily as needed. (Patient not taking: Reported on 11/02/2021) 10 mL 0   rosuvastatin (CRESTOR) 5 MG tablet Take 5 mg by mouth daily.  No current facility-administered medications on file prior to visit.   Allergies  Allergen Reactions   Azt [Zidovudine] Other (See Comments)    fatigue   Past Medical History:  Diagnosis Date   Anxiety    HIV infection (HCC) 04/11/1992   No past surgical history on file.  Social History   Socioeconomic History   Marital status: Single    Spouse name: Not on file   Number of children: Not on file   Years of education: Not on file   Highest education level: Not on file  Occupational History   Not on file  Tobacco Use   Smoking status: Former    Current packs/day: 0.00    Types: Cigarettes    Start date: 04/11/1993    Quit date: 04/11/2013    Years since quitting: 10.6   Smokeless tobacco: Former   Tobacco comments:    Occ  Vaping Use   Vaping status: Never Used  Substance and Sexual Activity   Alcohol use: Yes    Alcohol/week: 5.0 standard drinks of alcohol    Types: 5 Glasses of wine per week   Drug use: No   Sexual activity: Yes    Partners: Male    Birth control/protection: Condom    Comment: decined condoms  Other Topics Concern   Not on file  Social History Narrative   ** Merged History Encounter **       Social Drivers of Corporate investment banker Strain: Not on file  Food Insecurity: Not on file  Transportation Needs: Not on file  Physical Activity: Not on file  Stress: Not on file  Social Connections: Not on file   Intimate Partner Violence: Not on file   Family History  Problem Relation Age of Onset   Cancer Mother 24       Breast   Diabetes Father    Hypertension Father    Alzheimer's disease Father    Kidney disease Brother        Kidney transplant, 1994    Vitals  BP (!) 145/93   Pulse 73   Temp 97.9 F (36.6 C) (Temporal)   Ht 5' 11 (1.803 m)   Wt 176 lb (79.8 kg)   SpO2 96%   BMI 24.55 kg/m   Examination  Gen: no acute distress HEENT: [place]/AT, no scleral icterus, no pale conjunctivae, hearing normal, oral mucosa moist Neck: Supple Cardio: Regular rate and rhythm, S1S2 Resp: Pulmonary effort normal in room air, Normal breath sounds  GI: non distended, non tender and soft GU: Musc: Extremities: No pedal edema Skin: No rashes Neuro: grossly non focal , awake, alert and oriented * 3  Psych: Calm, cooperative  Lab Results HIV 1 RNA Quant (Copies/mL)  Date Value  11/10/2022 Not Detected  11/02/2021 Not Detected  10/13/2020 <20 (H)   CD4 T Cell Abs (/uL)  Date Value  11/10/2022 865  11/02/2021 861  10/13/2020 763   No results found for: HIV1GENOSEQ Lab Results  Component Value Date   WBC 7.4 11/10/2022   HGB 16.6 11/10/2022   HCT 47.1 11/10/2022   MCV 92.2 11/10/2022   PLT 213 11/10/2022    Lab Results  Component Value Date   CREATININE 0.98 11/10/2022   BUN 20 11/10/2022   NA 137 11/10/2022   K 4.0 11/10/2022   CL 105 11/10/2022   CO2 24 11/10/2022   Lab Results  Component Value Date   ALT 14 11/10/2022   AST 14 11/10/2022  ALKPHOS 81 07/20/2016   BILITOT 0.6 11/10/2022    Lab Results  Component Value Date   CHOL 220 (H) 11/02/2021   TRIG 222 (H) 11/02/2021   HDL 44 11/02/2021   LDLCALC 140 (H) 11/02/2021   No results found for: HAV Lab Results  Component Value Date   HEPBSAG NON-REACTIVE 11/10/2022   No results found for: HCVAB Lab Results  Component Value Date   CHLAMYDIAWP Negative 11/10/2022   CHLAMYDIAWP Negative 11/10/2022    CHLAMYDIAWP Negative 11/10/2022   N Negative 11/10/2022   N Negative 11/10/2022   N Negative 11/10/2022   No results found for: GCPROBEAPT No results found for: QUANTGOLD  Health Maintenance: Immunization History  Administered Date(s) Administered   Influenza,inj,Quad PF,6+ Mos 03/29/2017   Influenza-Unspecified 01/03/2013, 12/11/2014, 01/10/2016   Meningococcal Mcv4o 07/20/2016   Pneumococcal Conjugate-13 01/03/2013   Pneumococcal Polysaccharide-23 07/20/2016   Assessment/Plan: # HIV - continue Biktarvy , meds refilled  - labs today  - fu in a year  # STD screening  - GC triple screen, RPR  # Immunization  - PCV 20 vaccine today   # Health maintenance - Lipid panel today  - Anal pap today - discuss colon ca screening with PCP  Patient's labs were reviewed as well as his previous records. Patients questions were addressed and answered. Safe sex counseling done.   I spent 32  minutes involved in face-to-face and non-face-to-face activities for this patient on the day of the visit. Professional time spent includes the following activities: Preparing to see the patient (review of tests), Obtaining and reviewing separately obtained history (prior note from Dr Overton and Dr Elaine), Performing a medically appropriate examination and evaluation, Ordering medications/labs and vaccines, referring and communicating with other health care professionals, Documenting clinical information in the EMR, Independently interpreting results (not separately reported), Communicating results to the patient, Counseling and educating the patient and Care coordination (not separately reported).   Of note, portions of this note may have been created with voice recognition software. While this note has been edited for accuracy, occasional wrong-word or 'sound-a-like' substitutions may have occurred due to the inherent limitations of voice recognition software.   Electronically signed  by:  Annalee Orem, MD Infectious Disease Physician [facility] [address] [place], [place] [address] Phone: [phone]  Fax: [phone]

## 2023-12-15 ENCOUNTER — Other Ambulatory Visit: Payer: Self-pay

## 2023-12-15 DIAGNOSIS — B20 Human immunodeficiency virus [HIV] disease: Secondary | ICD-10-CM

## 2023-12-15 LAB — T-HELPER CELLS (CD4) COUNT (NOT AT ARMC)
CD4 % Helper T Cell: 42 % (ref 33–65)
CD4 T Cell Abs: 950 /uL (ref 400–1790)

## 2023-12-15 MED ORDER — BIKTARVY 50-200-25 MG PO TABS: 1.0000 | ORAL_TABLET | Freq: Every day | ORAL | 11 refills | Status: AC

## 2023-12-17 LAB — COMPREHENSIVE METABOLIC PANEL WITH GFR
AG Ratio: 1.9 (calc) (ref 1.0–2.5)
ALT: 14 U/L (ref 9–46)
AST: 13 U/L (ref 10–35)
Albumin: 4.9 g/dL (ref 3.6–5.1)
Alkaline phosphatase (APISO): 69 U/L (ref 35–144)
BUN: 20 mg/dL (ref 7–25)
CO2: 25 mmol/L (ref 20–32)
Calcium: 10.3 mg/dL (ref 8.6–10.3)
Chloride: 105 mmol/L (ref 98–110)
Creat: 1.07 mg/dL (ref 0.70–1.35)
Globulin: 2.6 g/dL (ref 1.9–3.7)
Glucose, Bld: 99 mg/dL (ref 65–99)
Potassium: 4.4 mmol/L (ref 3.5–5.3)
Sodium: 138 mmol/L (ref 135–146)
Total Bilirubin: 0.6 mg/dL (ref 0.2–1.2)
Total Protein: 7.5 g/dL (ref 6.1–8.1)
eGFR: 79 mL/min/1.73m2 (ref >=60)

## 2023-12-17 LAB — LIPID PANEL
Cholesterol: 141 mg/dL (ref <200)
HDL: 45 mg/dL (ref >=40)
LDL Cholesterol (Calc): 74 mg/dL
Non-HDL Cholesterol (Calc): 96 mg/dL (ref <130)
Total CHOL/HDL Ratio: 3.1 (calc) (ref <5.0)
Triglycerides: 136 mg/dL (ref <150)

## 2023-12-17 LAB — HIV RNA, RTPCR W/R GT (RTI, PI,INT)
HIV 1 RNA Quant: NOT DETECTED {copies}/mL
HIV-1 RNA Quant, Log: NOT DETECTED {Log_copies}/mL

## 2023-12-17 LAB — RPR: RPR Ser Ql: NONREACTIVE

## 2023-12-18 LAB — URINE CYTOLOGY ANCILLARY ONLY
Chlamydia: NEGATIVE
Comment: NEGATIVE
Comment: NORMAL
Neisseria Gonorrhea: NEGATIVE

## 2023-12-21 LAB — CYTOLOGY - PAP
Comment: NEGATIVE
Diagnosis: UNDETERMINED — AB
High risk HPV: NEGATIVE

## 2023-12-24 DIAGNOSIS — Z23 Encounter for immunization: Secondary | ICD-10-CM | POA: Insufficient documentation

## 2023-12-25 ENCOUNTER — Ambulatory Visit: Payer: Self-pay | Admitting: Infectious Diseases

## 2023-12-25 DIAGNOSIS — R85619 Unspecified abnormal cytological findings in specimens from anus: Secondary | ICD-10-CM

## 2023-12-25 NOTE — Telephone Encounter (Signed)
 Spoke with patient regarding results. Is requesting additional information regarding abnormal cells.  Explained to patient that this does not diagnose anything and that he would need additional screening. Verbalized understanding.  Denies needing refills of Crestor. Lorenda CHRISTELLA Code, RMA

## 2023-12-25 NOTE — Telephone Encounter (Signed)
-----   Message from Annalee Joseph sent at 12/25/2023 12:53 PM EDT ----- Please let him know his anal pap is abnormal and I am putting in referral for colorectal surgery for possible need of HRA.  Please check if he needs refill on rosuvastatin. If needs, send him rosuvastatin 5 mg po daily # 30 pills with 11 refills.   Unremarkable labs otherwise.   ----- Message ----- From: Rebecka Hose Lab Results In Sent: 12/15/2023  12:25 AM EDT To: Annalee Orem, MD

## 2024-01-03 NOTE — Telephone Encounter (Signed)
 Patient called, has not heard from. [facility] yet. Provided him with their phone number and encouraged him to reach out to schedule appointment.   Joshua Davis, BSN, RN

## 2024-01-29 ENCOUNTER — Other Ambulatory Visit: Payer: Self-pay | Admitting: General Surgery

## 2024-01-31 LAB — DERMATOLOGY PATHOLOGY

## 2024-12-17 ENCOUNTER — Ambulatory Visit: Admitting: Infectious Diseases
